# Patient Record
Sex: Female | Born: 1965 | Race: White | Hispanic: No | Marital: Single | Smoking: Never smoker
Health system: Southern US, Community
[De-identification: ages and names within clinical notes are randomized; demographics above are authoritative.]

## PROBLEM LIST (undated history)

## (undated) DIAGNOSIS — Z9889 Other specified postprocedural states: Secondary | ICD-10-CM

## (undated) DIAGNOSIS — F0630 Mood disorder due to known physiological condition, unspecified: Secondary | ICD-10-CM

## (undated) DIAGNOSIS — E282 Polycystic ovarian syndrome: Secondary | ICD-10-CM

## (undated) DIAGNOSIS — I10 Essential (primary) hypertension: Secondary | ICD-10-CM

## (undated) DIAGNOSIS — R112 Nausea with vomiting, unspecified: Secondary | ICD-10-CM

## (undated) HISTORY — PX: TONSILLECTOMY: SHX5217

## (undated) HISTORY — DX: Mood disorder due to known physiological condition, unspecified: F06.30

## (undated) HISTORY — DX: Essential (primary) hypertension: I10

---

## 1997-12-20 ENCOUNTER — Other Ambulatory Visit: Admission: RE | Admit: 1997-12-20 | Discharge: 1997-12-20 | Payer: Self-pay | Admitting: Obstetrics and Gynecology

## 1998-02-05 ENCOUNTER — Other Ambulatory Visit: Admission: RE | Admit: 1998-02-05 | Discharge: 1998-02-05 | Payer: Self-pay | Admitting: *Deleted

## 1998-08-22 ENCOUNTER — Other Ambulatory Visit: Admission: RE | Admit: 1998-08-22 | Discharge: 1998-08-22 | Payer: Self-pay | Admitting: Obstetrics and Gynecology

## 2001-05-11 ENCOUNTER — Other Ambulatory Visit: Admission: RE | Admit: 2001-05-11 | Discharge: 2001-05-11 | Payer: Self-pay | Admitting: Obstetrics and Gynecology

## 2007-12-15 ENCOUNTER — Ambulatory Visit: Payer: Self-pay | Admitting: Family Medicine

## 2008-02-04 ENCOUNTER — Ambulatory Visit: Payer: Self-pay | Admitting: Occupational Medicine

## 2008-05-05 ENCOUNTER — Ambulatory Visit: Payer: Self-pay | Admitting: Family Medicine

## 2009-02-07 HISTORY — PX: OTHER SURGICAL HISTORY: SHX169

## 2010-02-11 ENCOUNTER — Ambulatory Visit: Payer: Self-pay | Admitting: Family Medicine

## 2010-02-11 DIAGNOSIS — E282 Polycystic ovarian syndrome: Secondary | ICD-10-CM | POA: Insufficient documentation

## 2010-02-11 DIAGNOSIS — I10 Essential (primary) hypertension: Secondary | ICD-10-CM

## 2010-02-11 HISTORY — DX: Essential (primary) hypertension: I10

## 2010-02-12 ENCOUNTER — Encounter: Payer: Self-pay | Admitting: Family Medicine

## 2010-02-12 DIAGNOSIS — E785 Hyperlipidemia, unspecified: Secondary | ICD-10-CM | POA: Insufficient documentation

## 2010-02-12 LAB — CONVERTED CEMR LAB
ALT: 13 units/L (ref 0–35)
AST: 18 units/L (ref 0–37)
Alkaline Phosphatase: 57 units/L (ref 39–117)
Cholesterol, target level: 200 mg/dL
Glucose, Bld: 102 mg/dL — ABNORMAL HIGH (ref 70–99)
LDL Cholesterol: 157 mg/dL — ABNORMAL HIGH (ref 0–99)
Potassium: 4.6 meq/L (ref 3.5–5.3)
Sodium: 138 meq/L (ref 135–145)
TSH: 2.572 microintl units/mL (ref 0.350–4.500)
Total Bilirubin: 0.5 mg/dL (ref 0.3–1.2)
Total Protein: 7 g/dL (ref 6.0–8.3)
Triglycerides: 144 mg/dL (ref <150)
VLDL: 29 mg/dL (ref 0–40)

## 2010-04-08 ENCOUNTER — Telehealth (INDEPENDENT_AMBULATORY_CARE_PROVIDER_SITE_OTHER): Payer: Self-pay | Admitting: *Deleted

## 2010-04-09 NOTE — Assessment & Plan Note (Signed)
Summary: CPE   Vital Signs:  Patient profile:   45 year old female Height:      65 inches Weight:      206 pounds Pulse rate:   55 / minute BP sitting:   127 / 73  (right arm) Cuff size:   regular  Vitals Entered By: Avon Gully CMA, Duncan Dull) (February 11, 2010 10:42 AM) CC: CPE   Primary Care Charliene Inoue:  Nani Gasser MD  CC:  CPE.  History of Present Illness: Overall dong well. Started on metformin by her PCP for PCOS. Also started on a BP medication as had severeal high BPs there as well. She has been tolerating this well without SE.   Current Medications (verified): 1)  Mucinex D [phone] Mg Xr12h-Tab (Pseudoephedrine-Guaifenesin) 2)  Doxycycline Hyclate 100 Mg Caps (Doxycycline Hyclate) .... Take 1 Tablet By Mouth Two Times A Day For 10 Days 3)  Atenolol 100 Mg Tabs (Atenolol) 4)  Metformin Hcl 500 Mg Tabs (Metformin Hcl)  Allergies (verified): No Known Drug Allergies  Comments:  Nurse/Medical Assistant: The patient's medications and allergies were reviewed with the patient and were updated in the Medication and Allergy Lists. Avon Gully CMA, Duncan Dull) (February 11, 2010 10:43 AM)  Past History:  Past Medical History: Dr. Jannette Fogo at [facility]  Past Surgical History: Tonsillectomy Uterine Ablation 02/2009  Family History: Reviewed history from 12/15/2007 and no changes required. MOther with MI, depression, hi chol, HTN  Social History: Reviewed history from 12/15/2007 and no changes required. Partner is [employer]. Adopted son is Neita Goodnight (from [place]).  Have 2 children.  Works for [employer], [employer], [employer]. HS degree.  Never Smoked Alcohol use-yes Drug use-no Regular exercise-yes  Review of Systems  The patient denies anorexia, fever, weight loss, weight gain, vision loss, decreased hearing, hoarseness, chest pain, syncope, dyspnea on exertion, peripheral edema, prolonged cough, headaches, hemoptysis, abdominal pain, melena,  hematochezia, severe indigestion/heartburn, hematuria, incontinence, genital sores, muscle weakness, suspicious skin lesions, transient blindness, difficulty walking, depression, unusual weight change, abnormal bleeding, enlarged lymph nodes, and breast masses.    Physical Exam  General:  Well-developed,well-nourished,in no acute distress; alert,appropriate and cooperative throughout examination Head:  Normocephalic and atraumatic without obvious abnormalities. No apparent alopecia or balding. Eyes:  No corneal or conjunctival inflammation noted. EOMI. Perrla.  Ears:  External ear exam shows no significant lesions or deformities.  Otoscopic examination reveals clear canals, tympanic membranes are intact bilaterally without bulging, retraction, inflammation or discharge. Hearing is grossly normal bilaterally. Nose:  External nasal examination shows no deformity or inflammation.  Mouth:  Oral mucosa and oropharynx without lesions or exudates.  Teeth in good repair. Neck:  No deformities, masses, or tenderness noted. Chest Wall:  No deformities, masses, or tenderness noted. Lungs:  Normal respiratory effort, chest expands symmetrically. Lungs are clear to auscultation, no crackles or wheezes. Heart:  Normal rate and regular rhythm. S1 and S2 normal without gallop, murmur, click, rub or other extra sounds. Abdomen:  Bowel sounds positive,abdomen soft and non-tender without masses, organomegaly or hernias noted. Msk:  No deformity or scoliosis noted of thoracic or lumbar spine.   Pulses:  R and L carotid,radial,dorsalis pedis and posterior tibial pulses are full and equal bilaterally Extremities:  No clubbing, cyanosis, edema, or deformity noted with normal full range of motion of all joints.   Neurologic:  No cranial nerve deficits noted. Station and gait are normal. DTRs are symmetrical throughout. Sensory, motor and coordinative functions appear intact.  Skin:  no rashes.   Cervical Nodes:  No  lymphadenopathy noted Axillary Nodes:  No palpable lymphadenopathy Psych:  Cognition and judgment appear intact. Alert and cooperative with normal attention span and concentration. No apparent delusions, illusions, hallucinations   Impression & Recommendations:  Problem # 1:  HEALTH MAINTENANCE EXAM (ICD-V70.0) Encourage regular exercise.  Given tdap and flu vaccines today.  Reminded her to get her pelvic and pap with her gyn today.  Due for screening labwork as well.   Problem # 2:  HYPERTENSION, BENIGN (ICD-401.1) f/u in 6months Will check TSH as well.  Her updated medication list for this problem includes:    Atenolol 100 Mg Tabs (Atenolol) .Marland Kitchen... Take 1 tablet by mouth once a day    Hydrochlorothiazide 25 Mg Tabs (Hydrochlorothiazide) .Marland Kitchen... Take 1 tablet by mouth once a day  Orders: T-Comprehensive Metabolic Panel [phone]) T-Lipid Profile ([id number]) T-TSH [phone]) T- Hemoglobin A1C ([id number])  Complete Medication List: 1)  Atenolol 100 Mg Tabs (Atenolol) .... Take 1 tablet by mouth once a day 2)  Metformin Hcl 500 Mg Tabs (Metformin hcl) .... Take 1 tablet by mouth three times a day 3)  Hydrochlorothiazide 25 Mg Tabs (Hydrochlorothiazide) .... Take 1 tablet by mouth once a day  Other Orders: Flu Vaccine 29yrs + (28413) Admin 1st Vaccine (24401) Tdap => 72yrs IM (02725) Admin of Any Addtl Vaccine (36644)  Patient Instructions: 1)  Remember to get your mammogram and your pap smear this year.  2)  We will call you with your lab results.  3)  Your blood pressure looks great today. Follow up in 6 months for blood pressure (orwith your gyn).  4)  It is important that you exercise reguarly at least 20 minutes 5 times a week. If you develop chest pain, have severe difficulty breathing, or feel very tired, stop exercising immediately and seek medical attention.  5)  Take calcium +vitamin D daily.    Orders Added: 1)  T-Comprehensive Metabolic Panel  [[id number]] 2)  T-Lipid Profile [[id number]] 3)  T-TSH [[id number]] 4)  T- Hemoglobin A1C [83036-23375] 5)  Est. Patient age 38-64 [35] 6)  Flu Vaccine 1yrs + [90658] 7)  Admin 1st Vaccine [90471] 8)  Tdap => 73yrs IM [90715] 9)  Admin of Any Addtl Vaccine [90472]   Immunizations Administered:  Influenza Vaccine # 1:    Vaccine Type: Fluvax 3+    Site: left deltoid    Mfr: GlaxoSmithKline    Dose: 0.5 ml    Route: IM    Given by: Sue Lush McCrimmon CMA, (AAMA)    Exp. Date: 08/09/2010    Lot #: [id number]    VIS given: 10/02/09 version given February 11, 2010.  Tetanus Vaccine:    Vaccine Type: Tdap    Site: right deltoid    Mfr: GlaxoSmithKline    Dose: 0.5 ml    Route: IM    Given by: Sue Lush McCrimmon CMA, (AAMA)    Exp. Date: 12/09/2011    Lot #: [id number]    VIS given: 01/26/08 version given February 11, 2010.  Flu Vaccine Consent Questions:    Do you have a history of severe allergic reactions to this vaccine? no    Any prior history of allergic reactions to egg and/or gelatin? no    Do you have a sensitivity to the preservative Thimersol? no    Do you have a past history of Guillan-Barre Syndrome? no    Do you currently have an acute febrile illness? no  Have you ever had a severe reaction to latex? no    Vaccine information given and explained to patient? no    Are you currently pregnant? no   Immunizations Administered:  Influenza Vaccine # 1:    Vaccine Type: Fluvax 3+    Site: left deltoid    Mfr: GlaxoSmithKline    Dose: 0.5 ml    Route: IM    Given by: Sue Lush McCrimmon CMA, (AAMA)    Exp. Date: 08/09/2010    Lot #: [id number]    VIS given: 10/02/09 version given February 11, 2010.  Tetanus Vaccine:    Vaccine Type: Tdap    Site: right deltoid    Mfr: GlaxoSmithKline    Dose: 0.5 ml    Route: IM    Given by: Sue Lush McCrimmon CMA, (AAMA)    Exp. Date: 12/09/2011    Lot #: [id number]    VIS given: 01/26/08 version given February 11, 2010.

## 2010-04-17 NOTE — Progress Notes (Signed)
Summary: Needs mammo  Phone Note Outgoing Call   Summary of Call: Call EA:VWUJW mammogram. Can give her phone number downstairs to schedule.  Initial call taken by: Nani Gasser MD,  April 08, 2010 10:28 AM  Follow-up for Phone Call        [facility] informing Pt of the above Follow-up by: Payton Spark CMA,  April 09, 2010 9:49 AM

## 2010-07-18 ENCOUNTER — Encounter: Payer: Self-pay | Admitting: Family Medicine

## 2010-07-18 ENCOUNTER — Ambulatory Visit (INDEPENDENT_AMBULATORY_CARE_PROVIDER_SITE_OTHER): Payer: Commercial Indemnity | Admitting: Family Medicine

## 2010-07-18 DIAGNOSIS — I10 Essential (primary) hypertension: Secondary | ICD-10-CM

## 2010-07-18 DIAGNOSIS — L089 Local infection of the skin and subcutaneous tissue, unspecified: Secondary | ICD-10-CM

## 2010-07-18 DIAGNOSIS — R1310 Dysphagia, unspecified: Secondary | ICD-10-CM

## 2010-07-18 DIAGNOSIS — E785 Hyperlipidemia, unspecified: Secondary | ICD-10-CM

## 2010-07-18 MED ORDER — CITALOPRAM HYDROBROMIDE 20 MG PO TABS: 20.0000 mg | ORAL_TABLET | Freq: Every day | ORAL | Status: DC

## 2010-07-18 NOTE — Assessment & Plan Note (Signed)
Due to recheck.  It was elevated in December.  She has been exercising more regularly so hopefully it will look better.

## 2010-07-18 NOTE — Assessment & Plan Note (Signed)
BP elevated today but home BPs look great. She was a little anxious as she was late for her appt.  Asked pt to f/u in 1 mo to recheck BP and bing in her home cuff so we can verify its validity.  Continue current regimen until then.

## 2010-07-18 NOTE — Progress Notes (Signed)
  Subjective:    Patient ID: Alicia Steele, female    DOB: [birth year], 45 y.o.   MRN: [id number]  HPI 3 weeks ago had a grilled steak and then felt like it got lodged at the top of her stomach.  Has had this happen maybe 10 times.   The tied to dink to get it to go down but it wasn't going down.  Took almost an 1.5 hour to go down.  Thought initially had eaten too big of a bite.  No recent heartburn or GERD sxs.  No Nausea or vomiting.   HTN - Taking meds regularly. Recently started exercise regimen. Has lost 4 lbs since her last OV. No CP, SOB, dizziness.  BPs in th 120s/70s at home. Highest has been 132/82.  Uses a wrist cuff  Has a splinter for about a week that she would like me to try to remove.   Review of Systems     Objective:   Physical Exam  Constitutional: She appears well-developed and well-nourished.  HENT:  Head: Normocephalic and atraumatic.  Eyes: Conjunctivae are normal. Pupils are equal, round, and reactive to light.  Cardiovascular: Normal rate, regular rhythm and normal heart sounds.   Pulmonary/Chest: Effort normal and breath sounds normal.  Musculoskeletal: She exhibits no edema.  Skin:       Right hand, first finger with a splinter. Area lanced and splinter removed.           Assessment & Plan:  Dysphagia - refer to GI for possible  esophageal stricture.  Prefers [place]. In the meantime avoid meats, chew food really well, adn eat very small bites. Go to ED if can't get food to pass.    Splinter removal - pt tolerated well. Minimal bleeding.

## 2010-07-18 NOTE — Patient Instructions (Addendum)
We will call you with your lab results.  Go for your labs fasting anytime ibn the next 2 weeks.

## 2010-08-19 ENCOUNTER — Other Ambulatory Visit: Payer: Self-pay | Admitting: Family Medicine

## 2010-08-19 DIAGNOSIS — I10 Essential (primary) hypertension: Secondary | ICD-10-CM

## 2010-08-19 MED ORDER — TRIAMTERENE-HCTZ 37.5-25 MG PO CAPS: 1.0000 | ORAL_CAPSULE | ORAL | Status: DC

## 2010-08-19 NOTE — Telephone Encounter (Signed)
Received refill request for triam/hctz 37.5/25.  Last OV/CPE 02-11-10.  Note says to return in 6 mths for BP follow up. Plan:  Refill sent electronically for 30 days, and pt notified that she will need a  follow up BP visit . Jarvis Newcomer, LPN Domingo Dimes

## 2010-08-20 ENCOUNTER — Encounter: Payer: Self-pay | Admitting: Family Medicine

## 2010-08-20 DIAGNOSIS — K200 Eosinophilic esophagitis: Secondary | ICD-10-CM | POA: Insufficient documentation

## 2010-08-27 ENCOUNTER — Other Ambulatory Visit: Payer: Self-pay | Admitting: Family Medicine

## 2010-08-27 MED ORDER — METFORMIN HCL 500 MG PO TABS: 500.0000 mg | ORAL_TABLET | Freq: Three times a day (TID) | ORAL | Status: DC

## 2010-08-27 NOTE — Telephone Encounter (Signed)
Gregg from [facility] called and pt is requesting refill for her metformin 500 mg tid Plan:  Reviewed chart.  Recent OV.  #90/2 refills given verbally, and placed in system electronically. Jarvis Newcomer, LPN Domingo Dimes

## 2010-09-19 ENCOUNTER — Other Ambulatory Visit: Payer: Self-pay | Admitting: Family Medicine

## 2010-09-19 ENCOUNTER — Ambulatory Visit (INDEPENDENT_AMBULATORY_CARE_PROVIDER_SITE_OTHER): Payer: Commercial Indemnity | Admitting: Family Medicine

## 2010-09-19 VITALS — BP 140/80 | HR 78

## 2010-09-19 DIAGNOSIS — I10 Essential (primary) hypertension: Secondary | ICD-10-CM

## 2010-09-19 MED ORDER — ATENOLOL 100 MG PO TABS: 100.0000 mg | ORAL_TABLET | Freq: Every day | ORAL | Status: DC

## 2010-09-19 NOTE — Progress Notes (Signed)
  Subjective:    Patient ID: Alicia Steele, female    DOB: [birth year], 45 y.o.   MRN: [id number]  HPI   Pt denies chest pain, SOB, dizziness, or heart palpitations.  Taking meds as directed w/o problems.  Denies medication side effects.  5 min spent with pt.  Review of Systems     Objective:   Physical Exam        Assessment & Plan:  HTN- Increase atenolol to bid. Follow up with me in 6 weeks.

## 2010-09-20 ENCOUNTER — Telehealth: Payer: Self-pay | Admitting: Family Medicine

## 2010-09-20 LAB — LIPID PANEL
Cholesterol: 245 mg/dL — ABNORMAL HIGH (ref 0–200)
HDL: 69 mg/dL (ref >39)
Total CHOL/HDL Ratio: 3.6 Ratio
VLDL: 30 mg/dL (ref 0–40)

## 2010-09-20 LAB — GLUCOSE, RANDOM: Glucose, Bld: 94 mg/dL (ref 70–99)

## 2010-09-20 NOTE — Telephone Encounter (Signed)
Patient: Cholesterol overall just a little it better. Her LDL is down about 10 points but this is still high. I would recommend continue to work with low fat diet regular exercise and let's recheck again in 6 months. Is under 100 which looks great. We'll recheck this in one year.

## 2010-09-22 ENCOUNTER — Other Ambulatory Visit: Payer: Self-pay | Admitting: Family Medicine

## 2010-09-23 ENCOUNTER — Telehealth: Payer: Self-pay | Admitting: Family Medicine

## 2010-09-23 NOTE — Telephone Encounter (Signed)
Pt needs to follow up around 11-03-10 as instructed at last office visit on 09-19-10.

## 2010-09-23 NOTE — Telephone Encounter (Signed)
LMOM advising pt of results and rec. 

## 2010-09-24 NOTE — Telephone Encounter (Signed)
Closed

## 2010-10-25 ENCOUNTER — Other Ambulatory Visit: Payer: Self-pay | Admitting: Family Medicine

## 2010-10-25 NOTE — Telephone Encounter (Signed)
PLEASE HAVE PT CALL TRIAGE NURSE TO SCHEDULE AN 6 WEEK FOLLOW UP FOR BP IF DOES NOT ALREADY HAVE AN APPT.  NEEDS TO BE SEEN NOW FOR BP.

## 2010-11-17 ENCOUNTER — Other Ambulatory Visit: Payer: Self-pay | Admitting: Family Medicine

## 2010-11-25 ENCOUNTER — Other Ambulatory Visit: Payer: Self-pay | Admitting: Family Medicine

## 2011-01-20 ENCOUNTER — Other Ambulatory Visit: Payer: Self-pay | Admitting: *Deleted

## 2011-01-20 MED ORDER — CITALOPRAM HYDROBROMIDE 20 MG PO TABS: 20.0000 mg | ORAL_TABLET | Freq: Every day | ORAL | Status: DC

## 2011-01-21 ENCOUNTER — Other Ambulatory Visit: Payer: Self-pay | Admitting: Family Medicine

## 2011-01-29 ENCOUNTER — Other Ambulatory Visit: Payer: Self-pay | Admitting: Family Medicine

## 2011-02-14 ENCOUNTER — Other Ambulatory Visit: Payer: Self-pay | Admitting: Family Medicine

## 2011-02-27 ENCOUNTER — Other Ambulatory Visit: Payer: Self-pay | Admitting: Family Medicine

## 2011-03-22 ENCOUNTER — Other Ambulatory Visit: Payer: Self-pay | Admitting: Family Medicine

## 2011-03-24 ENCOUNTER — Other Ambulatory Visit: Payer: Self-pay | Admitting: Family Medicine

## 2011-04-08 ENCOUNTER — Telehealth: Payer: Self-pay | Admitting: Family Medicine

## 2011-04-08 DIAGNOSIS — R899 Unspecified abnormal finding in specimens from other organs, systems and tissues: Secondary | ICD-10-CM

## 2011-04-08 NOTE — Telephone Encounter (Signed)
Called patient she is due for a six-month followup for her high blood pressure in addition to a BMP. We can order the labs if she would like to go ahead of time that is up to her.

## 2011-04-10 NOTE — Telephone Encounter (Signed)
Pt informed. States she will have blood work done and will schedule appt to f/u.

## 2011-04-23 LAB — BASIC METABOLIC PANEL
CO2: 21 mEq/L (ref 19–32)
Calcium: 9.4 mg/dL (ref 8.4–10.5)
Chloride: 104 mEq/L (ref 96–112)
Glucose, Bld: 97 mg/dL (ref 70–99)
Sodium: 138 mEq/L (ref 135–145)

## 2011-04-23 LAB — LIPID PANEL
HDL: 66 mg/dL (ref >39)
LDL Cholesterol: 133 mg/dL — ABNORMAL HIGH (ref 0–99)
Total CHOL/HDL Ratio: 3.6 Ratio

## 2011-04-26 ENCOUNTER — Other Ambulatory Visit: Payer: Self-pay | Admitting: Family Medicine

## 2011-04-26 DIAGNOSIS — E875 Hyperkalemia: Secondary | ICD-10-CM

## 2011-04-28 ENCOUNTER — Other Ambulatory Visit: Payer: Self-pay | Admitting: *Deleted

## 2011-04-28 MED ORDER — METFORMIN HCL 500 MG PO TABS: 500.0000 mg | ORAL_TABLET | Freq: Three times a day (TID) | ORAL | Status: DC

## 2011-05-25 ENCOUNTER — Other Ambulatory Visit: Payer: Self-pay | Admitting: Family Medicine

## 2011-05-26 NOTE — Telephone Encounter (Signed)
Needs appointment

## 2011-06-22 ENCOUNTER — Other Ambulatory Visit: Payer: Self-pay | Admitting: Family Medicine

## 2011-06-30 ENCOUNTER — Telehealth: Payer: Self-pay | Admitting: Family Medicine

## 2011-06-30 NOTE — Telephone Encounter (Signed)
Please call patients who she would like to go ahead and schedule a mammogram. I do not see one on call for her. If she does have his let me know and we can schedule her downstairs.

## 2011-07-01 NOTE — Telephone Encounter (Signed)
Left message on vm

## 2011-07-17 ENCOUNTER — Other Ambulatory Visit: Payer: Self-pay | Admitting: Family Medicine

## 2011-07-21 ENCOUNTER — Other Ambulatory Visit: Payer: Self-pay | Admitting: Family Medicine

## 2011-07-25 ENCOUNTER — Ambulatory Visit (INDEPENDENT_AMBULATORY_CARE_PROVIDER_SITE_OTHER): Payer: Commercial Indemnity | Admitting: Family Medicine

## 2011-07-25 ENCOUNTER — Encounter: Payer: Self-pay | Admitting: Family Medicine

## 2011-07-25 VITALS — BP 135/86 | HR 71 | Wt 201.0 lb

## 2011-07-25 DIAGNOSIS — E785 Hyperlipidemia, unspecified: Secondary | ICD-10-CM

## 2011-07-25 DIAGNOSIS — R899 Unspecified abnormal finding in specimens from other organs, systems and tissues: Secondary | ICD-10-CM

## 2011-07-25 DIAGNOSIS — I10 Essential (primary) hypertension: Secondary | ICD-10-CM

## 2011-07-25 DIAGNOSIS — E282 Polycystic ovarian syndrome: Secondary | ICD-10-CM

## 2011-07-25 DIAGNOSIS — R6889 Other general symptoms and signs: Secondary | ICD-10-CM

## 2011-07-25 LAB — POCT GLYCOSYLATED HEMOGLOBIN (HGB A1C): Hemoglobin A1C: 5.6

## 2011-07-25 MED ORDER — METFORMIN HCL 500 MG PO TABS: 500.0000 mg | ORAL_TABLET | Freq: Three times a day (TID) | ORAL | Status: DC

## 2011-07-25 MED ORDER — CITALOPRAM HYDROBROMIDE 20 MG PO TABS: 20.0000 mg | ORAL_TABLET | Freq: Every day | ORAL | Status: DC

## 2011-07-25 MED ORDER — METOPROLOL SUCCINATE ER 50 MG PO TB24: 50.0000 mg | ORAL_TABLET | Freq: Every day | ORAL | Status: DC

## 2011-07-25 MED ORDER — TRIAMTERENE-HCTZ 37.5-25 MG PO CAPS: 1.0000 | ORAL_CAPSULE | Freq: Every day | ORAL | Status: DC

## 2011-07-25 NOTE — Patient Instructions (Signed)
Please call to schedule [phone] your mammogram.

## 2011-07-25 NOTE — Progress Notes (Signed)
  Subjective:    Patient ID: Alicia Steele, female    DOB: 23-Feb-1966, 46 y.o.   MRN: [id number]  HPI HTN - No CP or SOB.  Tolerates the antenolol well. Regular exercise.  Trying to eat a low salt diet.  No swelling.  Taking her medications regularly. No recent NSAID use. She does need refills on all of her medications today. She did have some questions about taking the metformin for her PCO S. She wanted to know if it was still necessary or not.   Review of Systems     Objective:   Physical Exam  Constitutional: She is oriented to person, place, and time. She appears well-developed and well-nourished.  HENT:  Head: Normocephalic and atraumatic.  Cardiovascular: Normal rate, regular rhythm and normal heart sounds.   Pulmonary/Chest: Effort normal and breath sounds normal.  Neurological: She is alert and oriented to person, place, and time.  Skin: Skin is warm and dry.  Psychiatric: She has a normal mood and affect. Her behavior is normal.          Assessment & Plan:  HTN- Well controlled. Discussed will change to metoprolol for better risk benefits.  F/U in 3 months. Work on low salt diet and exercise. F/U in 4 months.   Overdue fo rmammo. Will give her the number to call. We had already made referral before but she didn't schedule.   PCO S.-we discussed that the metformin helps regulate her weight as well as helping to regulate her blood sugars. She is not interested in fertility at this time. We did discuss that she could come off the medication if she would like if her A1c is well-controlled, and she continues to eat healthy maintain a healthy weight and we monitor her blood sugars. Her A1c was well-controlled today but she opted to continue metformin especially with the potential weight benefits. I did encourage more regular exercise. Lab Results  Component Value Date   HGBA1C 5.6 07/25/2011   Hyperlipidemia-due to recheck lipid levels today. Lab slip given today.  Abnormal  potassium level-well over due to recheck. Lab slip given today.

## 2011-07-26 ENCOUNTER — Other Ambulatory Visit: Payer: Self-pay | Admitting: Family Medicine

## 2011-09-03 ENCOUNTER — Other Ambulatory Visit: Payer: Self-pay | Admitting: Family Medicine

## 2011-11-01 ENCOUNTER — Other Ambulatory Visit: Payer: Self-pay | Admitting: Family Medicine

## 2011-11-02 ENCOUNTER — Other Ambulatory Visit: Payer: Self-pay | Admitting: Family Medicine

## 2011-11-25 ENCOUNTER — Encounter: Payer: Commercial Indemnity | Admitting: Family Medicine

## 2011-11-25 DIAGNOSIS — Z0289 Encounter for other administrative examinations: Secondary | ICD-10-CM

## 2012-01-01 ENCOUNTER — Other Ambulatory Visit: Payer: Self-pay | Admitting: Family Medicine

## 2012-02-07 ENCOUNTER — Other Ambulatory Visit: Payer: Self-pay | Admitting: Family Medicine

## 2012-02-29 ENCOUNTER — Other Ambulatory Visit: Payer: Self-pay | Admitting: Family Medicine

## 2012-03-05 ENCOUNTER — Other Ambulatory Visit: Payer: Self-pay | Admitting: Family Medicine

## 2012-03-23 ENCOUNTER — Other Ambulatory Visit: Payer: Self-pay | Admitting: Family Medicine

## 2012-04-15 ENCOUNTER — Other Ambulatory Visit: Payer: Self-pay | Admitting: Family Medicine

## 2012-05-12 ENCOUNTER — Other Ambulatory Visit: Payer: Self-pay | Admitting: Family Medicine

## 2012-05-12 DIAGNOSIS — Z1231 Encounter for screening mammogram for malignant neoplasm of breast: Secondary | ICD-10-CM

## 2012-05-20 ENCOUNTER — Ambulatory Visit (INDEPENDENT_AMBULATORY_CARE_PROVIDER_SITE_OTHER): Payer: Commercial Indemnity

## 2012-05-20 ENCOUNTER — Encounter: Payer: Self-pay | Admitting: Family Medicine

## 2012-05-20 ENCOUNTER — Ambulatory Visit (INDEPENDENT_AMBULATORY_CARE_PROVIDER_SITE_OTHER): Payer: Commercial Indemnity | Admitting: Family Medicine

## 2012-05-20 VITALS — BP 145/84 | HR 72 | Ht 66.0 in | Wt 186.0 lb

## 2012-05-20 DIAGNOSIS — R928 Other abnormal and inconclusive findings on diagnostic imaging of breast: Secondary | ICD-10-CM

## 2012-05-20 DIAGNOSIS — E785 Hyperlipidemia, unspecified: Secondary | ICD-10-CM

## 2012-05-20 DIAGNOSIS — I10 Essential (primary) hypertension: Secondary | ICD-10-CM

## 2012-05-20 DIAGNOSIS — F0630 Mood disorder due to known physiological condition, unspecified: Secondary | ICD-10-CM | POA: Insufficient documentation

## 2012-05-20 DIAGNOSIS — E282 Polycystic ovarian syndrome: Secondary | ICD-10-CM

## 2012-05-20 DIAGNOSIS — F39 Unspecified mood [affective] disorder: Secondary | ICD-10-CM

## 2012-05-20 DIAGNOSIS — Z1231 Encounter for screening mammogram for malignant neoplasm of breast: Secondary | ICD-10-CM

## 2012-05-20 HISTORY — DX: Mood disorder due to known physiological condition, unspecified: F06.30

## 2012-05-20 LAB — COMPLETE METABOLIC PANEL WITH GFR
ALT: 13 U/L (ref 0–35)
AST: 15 U/L (ref 0–37)
Albumin: 4.3 g/dL (ref 3.5–5.2)
Alkaline Phosphatase: 53 U/L (ref 39–117)
Potassium: 4.7 mEq/L (ref 3.5–5.3)
Sodium: 134 mEq/L — ABNORMAL LOW (ref 135–145)
Total Protein: 7.1 g/dL (ref 6.0–8.3)

## 2012-05-20 LAB — LIPID PANEL
LDL Cholesterol: 124 mg/dL — ABNORMAL HIGH (ref 0–99)
Total CHOL/HDL Ratio: 3.1 Ratio
VLDL: 25 mg/dL (ref 0–40)

## 2012-05-20 MED ORDER — METOPROLOL SUCCINATE ER 50 MG PO TB24: 50.0000 mg | ORAL_TABLET | Freq: Every day | ORAL | Status: DC

## 2012-05-20 MED ORDER — METFORMIN HCL 500 MG PO TABS: 500.0000 mg | ORAL_TABLET | Freq: Every day | ORAL | Status: DC

## 2012-05-20 MED ORDER — CITALOPRAM HYDROBROMIDE 20 MG PO TABS: 20.0000 mg | ORAL_TABLET | Freq: Every day | ORAL | Status: DC

## 2012-05-20 MED ORDER — TRIAMTERENE-HCTZ 37.5-25 MG PO CAPS: 1.0000 | ORAL_CAPSULE | Freq: Every day | ORAL | Status: DC

## 2012-05-20 NOTE — Progress Notes (Signed)
Subjective:    Patient ID: Alicia Steele, female    DOB: 1965-09-03, 47 y.o.   MRN: [id number]  HPI HTN -  Pt denies chest pain, SOB, dizziness, or heart palpitations.  Taking meds as directed w/o problems.  Denies medication side effects. Home BPs running under 140.   Hyperlipidemia-Not on medication. Some exercise but not consistent. She has cut back on portion sizes that has lost a couple pounds.  PCO S.-on metformin for PCOS. Tolerating well. No regular exercise since the fall.   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 Works well. Doesn't want to stop it. Sleeping well.    Review of Systems BP 145/84  Pulse 72  Ht 5\' 6"  (1.676 m)  Wt 186 lb (84.369 kg)  BMI 30.04 kg/m2    No Known Allergies  No past medical history on file.  Past Surgical History  Procedure Laterality Date  . Tonsillectomy    . Uterine ablation  02/2009    History   Social History  . Marital Status: Single    Spouse Name: N/A    Number of Children: N/A  . Years of Education: N/A   Occupational History  . Not on file.   Social History Main Topics  . Smoking status: Never Smoker   . Smokeless tobacco: Not on file  . Alcohol Use: Yes  . Drug Use: No  . Sexually Active:    Other Topics Concern  . Not on file   Social History Narrative  . No narrative on file    Family History  Problem Relation Age of Onset  . Heart disease Mother     MI  . Hyperlipidemia Mother   . Hypertension Mother   . Depression Mother     Outpatient Encounter Prescriptions as of 05/20/2012  Medication Sig Dispense Refill  . citalopram (CELEXA) 20 MG tablet Take 1 tablet (20 mg total) by mouth daily.  30 tablet  6  . metFORMIN (GLUCOPHAGE) 500 MG tablet Take 1 tablet (500 mg total) by mouth daily with breakfast.  30 tablet  4  . metoprolol succinate (TOPROL-XL) 50 MG 24 hr tablet Take 1 tablet (50 mg total) by mouth daily. Take with or immediately following a meal.  30 tablet  4  .  triamterene-hydrochlorothiazide (DYAZIDE) 37.5-25 MG per capsule Take 1 each (1 capsule total) by mouth daily.  30 capsule  4  . [DISCONTINUED] atenolol (TENORMIN) 100 MG tablet TAKE ONE TABLET BY MOUTH ONE TIME DAILY  30 tablet  4  . [DISCONTINUED] citalopram (CELEXA) 20 MG tablet TAKE ONE TABLET BY MOUTH ONE TIME DAILY  30 tablet  0  . [DISCONTINUED] metFORMIN (GLUCOPHAGE) 500 MG tablet TAKE ONE TABLET BY MOUTH THREE TIMES DAILY  30 tablet  0  . [DISCONTINUED] metoprolol succinate (TOPROL-XL) 50 MG 24 hr tablet TAKE ONE TABLET BY MOUTH ONE TIME DAILY IMMEDIATELY FOLLOWING A MEAL.  30 tablet  2  . [DISCONTINUED] triamterene-hydrochlorothiazide (DYAZIDE) 37.5-25 MG per capsule TAKE ONE CAPSULE BY MOUTH ONE TIME DAILY  30 capsule  0  . [DISCONTINUED] citalopram (CELEXA) 20 MG tablet Take 1 tablet (20 mg total) by mouth daily.  90 tablet  1   No facility-administered encounter medications on file as of 05/20/2012.          Objective:   Physical Exam  Constitutional: She is oriented to person, place, and time. She appears well-developed and well-nourished.  HENT:  Head: Normocephalic and atraumatic.  Neck: Neck supple. No thyromegaly present.  Cardiovascular: Normal rate, regular rhythm and normal heart sounds.   No carotid bruits.   Pulmonary/Chest: Effort normal and breath sounds normal.  Musculoskeletal: She exhibits no edema.  Lymphadenopathy:    She has no cervical adenopathy.  Neurological: She is alert and oriented to person, place, and time.  Skin: Skin is warm and dry.  Psychiatric: She has a normal mood and affect. Her behavior is normal.          Assessment & Plan:  HTN- Uncontrolled today but home BPs well controlled. She will check them more closely over the next months.  Make sure eating low salt diet. Has already cut back on her portion sizes.  Check BMP today.    Hyperlipidemia-Due recheck levels.  Given lab slip today.   PCO S.-WEll controlled. Recommend more  regular exercise. A1C is 5.5 which is fantastic. We'll continue to monitor.  Mood D/O - Well controlled on celexa.  GAD-7 score of 7.  Has trouble relaxing.  She wants to continue the medication is not interested in weaning off of it at this time.

## 2012-05-21 ENCOUNTER — Other Ambulatory Visit: Payer: Self-pay | Admitting: Family Medicine

## 2012-05-21 DIAGNOSIS — E871 Hypo-osmolality and hyponatremia: Secondary | ICD-10-CM

## 2012-05-21 DIAGNOSIS — R7309 Other abnormal glucose: Secondary | ICD-10-CM

## 2012-05-24 ENCOUNTER — Other Ambulatory Visit: Payer: Self-pay | Admitting: Family Medicine

## 2012-05-24 DIAGNOSIS — R928 Other abnormal and inconclusive findings on diagnostic imaging of breast: Secondary | ICD-10-CM

## 2012-05-25 NOTE — Progress Notes (Signed)
Pt called back wanting to know about the results of her mammogram

## 2012-05-28 LAB — HM PAP SMEAR: HM Pap smear: NEGATIVE

## 2012-06-04 ENCOUNTER — Ambulatory Visit
Admission: RE | Admit: 2012-06-04 | Discharge: 2012-06-04 | Disposition: A | Discharge status: Home / Self Care | Payer: Commercial Indemnity | Source: Ambulatory Visit | Attending: Family Medicine | Admitting: Family Medicine

## 2012-06-04 DIAGNOSIS — R928 Other abnormal and inconclusive findings on diagnostic imaging of breast: Secondary | ICD-10-CM

## 2012-06-22 ENCOUNTER — Telehealth: Payer: Self-pay | Admitting: *Deleted

## 2012-06-22 MED ORDER — METFORMIN HCL 500 MG PO TABS: 500.0000 mg | ORAL_TABLET | Freq: Three times a day (TID) | ORAL | Status: DC

## 2012-06-22 NOTE — Telephone Encounter (Signed)
Alicia Steele, Looks like Dr. Judie Petit meant to have this three times a day as originally prescribed, can you please relay this order to her pharmacy?

## 2012-06-22 NOTE — Telephone Encounter (Signed)
Pharmacist calls and states received rx for Metformin with directions of once a day. Pt states she has been taking this three times a day. Can you please clarify this

## 2012-08-17 ENCOUNTER — Ambulatory Visit (INDEPENDENT_AMBULATORY_CARE_PROVIDER_SITE_OTHER): Payer: Commercial Indemnity | Admitting: Family Medicine

## 2012-08-17 ENCOUNTER — Encounter: Payer: Self-pay | Admitting: Family Medicine

## 2012-08-17 VITALS — BP 150/95 | HR 98 | Wt 181.0 lb

## 2012-08-17 DIAGNOSIS — F4389 Other reactions to severe stress: Secondary | ICD-10-CM

## 2012-08-17 DIAGNOSIS — I10 Essential (primary) hypertension: Secondary | ICD-10-CM

## 2012-08-17 DIAGNOSIS — F438 Other reactions to severe stress: Secondary | ICD-10-CM

## 2012-08-17 DIAGNOSIS — F430 Acute stress reaction: Secondary | ICD-10-CM

## 2012-08-17 MED ORDER — CLONAZEPAM 0.5 MG PO TABS: ORAL_TABLET | ORAL | Status: DC

## 2012-08-17 MED ORDER — CITALOPRAM HYDROBROMIDE 40 MG PO TABS: 40.0000 mg | ORAL_TABLET | Freq: Every day | ORAL | Status: DC

## 2012-08-17 NOTE — Progress Notes (Signed)
CC: Alicia Steele is a 47 y.o. female is here for Depression and Anxiety   Subjective: HPI:  Patient presents for same day appointment complaining of crying spells and depressive episodes ever since her partner of over 20 years moved out somewhat abruptly.  This occurred Thursday of last week since then she describes crying spells lasting minutes anytime of the day, emotional liability, mood swings moderate to severe in severity occurring anytime of the day. Symptoms are worse the more she thinks about her former relationship, symptoms are greatly improved with distractibility and keeping busy. Relationship has been complicated ove rthe past 2+ years due to patient not feeling wanted/needed and not receiving any feeling of love or affection from her partner. Also complicated by 26 year old adopted son who is not taking the slpit up very well.  Patient has good social support with a circle that does not include her former partner. Patient denies thoughts wanting to harm herself or others. She continues to take Celexa 20 mg daily. She reports trouble sleeping due to the motions above on a nightly basis   Review Of Systems Outlined In HPI  Past Medical History  Diagnosis Date  . HYPERTENSION, BENIGN 02/11/2010    Qualifier: Diagnosis of  By: Linford Arnold MD, Santina Evans    . 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 Works well.       Family History  Problem Relation Age of Onset  . Heart disease Mother     MI  . Hyperlipidemia Mother   . Hypertension Mother   . Depression Mother      History  Substance Use Topics  . Smoking status: Never Smoker   . Smokeless tobacco: Not on file  . Alcohol Use: Yes     Objective: Filed Vitals:   08/17/12 1450  BP: 150/95  Pulse:     Vital signs reviewed. General: Alert and Oriented, No Acute Distress HEENT: Pupils equal, round, reactive to light. Conjunctivae clear.  External ears unremarkable.  Moist  mucous membranes. Lungs: Clear and comfortable work of breathing, speaking in full sentences without accessory muscle use. Cardiac: Regular rate and rhythm.  Neuro: CN II-XII grossly intact, gait normal. Extremities: No peripheral edema.  Strong peripheral pulses.  Mental Status: Mild anxiety but no agitation. Logical though process. Frequently tearful. Skin: Warm and dry. Assessment & Plan: Alicia Steele was seen today for depression and anxiety.  Diagnoses and associated orders for this visit:  HYPERTENSION, BENIGN  Acute stress disorder - clonazePAM (KLONOPIN) 0.5 MG tablet; Half to whole tab every evening only as needed for anxiety or insomnia. - citalopram (CELEXA) 40 MG tablet; Take 1 tablet (40 mg total) by mouth daily.    Acute stress disorder: We spent over 40 minutes face-to-face discussing coping mechanisms and healthy ways to deal with this unfortunate chapter in her life and provided counseling on her frustrations with the situation. Patient is not interested in counseling at this time, I've encouraged her to spend as much time with friends as possible to help give her support. I like her to go up on her Celexa to 40 mg a day and consider using small dose of Klonopin to help with sleep for sleep and avoid this during the day. Instructed her that I would only think she would need this for the next 2-3 weeks. Verbally contracted for safety  Return with PCP in one week  45 minutes spent face-to-face during visit today of which at  least 50% was counseling or coordinating care regarding acute stress disorder, essential hypertension    Return in about 1 day (around 08/18/2012).

## 2012-10-04 ENCOUNTER — Ambulatory Visit (INDEPENDENT_AMBULATORY_CARE_PROVIDER_SITE_OTHER): Payer: Commercial Indemnity | Admitting: Family Medicine

## 2012-10-04 ENCOUNTER — Encounter: Payer: Self-pay | Admitting: Family Medicine

## 2012-10-04 VITALS — BP 160/103 | HR 93 | Wt 177.0 lb

## 2012-10-04 DIAGNOSIS — F438 Other reactions to severe stress: Secondary | ICD-10-CM

## 2012-10-04 DIAGNOSIS — F430 Acute stress reaction: Secondary | ICD-10-CM

## 2012-10-04 DIAGNOSIS — I10 Essential (primary) hypertension: Secondary | ICD-10-CM

## 2012-10-04 DIAGNOSIS — F4389 Other reactions to severe stress: Secondary | ICD-10-CM

## 2012-10-04 MED ORDER — CLONAZEPAM 0.5 MG PO TABS: ORAL_TABLET | ORAL | Status: DC

## 2012-10-04 MED ORDER — METOPROLOL SUCCINATE ER 100 MG PO TB24: 100.0000 mg | ORAL_TABLET | Freq: Every day | ORAL | Status: DC

## 2012-10-04 MED ORDER — CITALOPRAM HYDROBROMIDE 40 MG PO TABS: 40.0000 mg | ORAL_TABLET | Freq: Every day | ORAL | Status: DC

## 2012-10-04 NOTE — Progress Notes (Signed)
  Subjective:    Patient ID: Alicia Steele, female    DOB: [birth year], 47 y.o.   MRN: [id number]  HPI She says she is doing some better. Still struggling with the loss of hre long time partner. Sleep is fair. Has been using the xanax  At bedtime.  She has good supportive frineds. She's tolerating the 40 mg of citalopram well without any side effects or problems or palpitations.  Hypertension-  Pt denies chest pain, SOB, dizziness, or heart palpitations.  Taking meds as directed w/o problems.  Denies medication side effects.     Review of Systems     Objective:   Physical Exam  Constitutional: She is oriented to person, place, and time. She appears well-developed and well-nourished.  HENT:  Head: Normocephalic and atraumatic.  Cardiovascular: Normal rate, regular rhythm and normal heart sounds.   Pulmonary/Chest: Effort normal and breath sounds normal.  Neurological: She is alert and oriented to person, place, and time.  Skin: Skin is warm and dry.  Psychiatric: She has a normal mood and affect. Her behavior is normal.          Assessment & Plan:  Acute situational depression - GAD-7 score of 11 today. Overall she is doing somewhat better. She's happy with current dose on her medication. Offered grieving counseling if she would like. She says right now she has good support in her life and is okay with that but will think about it. Followup in one month.  HTN-  Uncontrolled.weill increase the metoprolol 200 mg. Followup in one month to recheck blood pressure.Marland Kitchen

## 2012-10-16 ENCOUNTER — Other Ambulatory Visit: Payer: Self-pay | Admitting: Family Medicine

## 2013-01-21 ENCOUNTER — Other Ambulatory Visit: Payer: Self-pay | Admitting: Family Medicine

## 2013-02-16 ENCOUNTER — Other Ambulatory Visit: Payer: Self-pay | Admitting: Family Medicine

## 2013-03-01 ENCOUNTER — Other Ambulatory Visit: Payer: Self-pay | Admitting: Family Medicine

## 2013-03-08 ENCOUNTER — Other Ambulatory Visit: Payer: Self-pay | Admitting: *Deleted

## 2013-03-08 ENCOUNTER — Telehealth: Payer: Self-pay | Admitting: *Deleted

## 2013-03-08 DIAGNOSIS — F430 Acute stress reaction: Secondary | ICD-10-CM

## 2013-03-08 MED ORDER — TRIAMTERENE-HCTZ 37.5-25 MG PO CAPS: ORAL_CAPSULE | ORAL | Status: DC

## 2013-03-08 MED ORDER — METOPROLOL SUCCINATE ER 200 MG PO TB24: ORAL_TABLET | ORAL | Status: DC

## 2013-03-08 MED ORDER — CLONAZEPAM 0.5 MG PO TABS: ORAL_TABLET | ORAL | Status: DC

## 2013-03-08 MED ORDER — CITALOPRAM HYDROBROMIDE 40 MG PO TABS: 40.0000 mg | ORAL_TABLET | Freq: Every day | ORAL | Status: DC

## 2013-03-08 MED ORDER — METFORMIN HCL 500 MG PO TABS: ORAL_TABLET | ORAL | Status: DC
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
## 2013-04-13 ENCOUNTER — Telehealth: Payer: Self-pay | Admitting: *Deleted

## 2013-04-13 MED ORDER — TRIAMTERENE-HCTZ 37.5-25 MG PO CAPS: ORAL_CAPSULE | ORAL | Status: DC

## 2013-04-13 NOTE — Telephone Encounter (Signed)
Pt called and stated that she noticed that the metoprolol is 200 mg instead of 100 mg. I informed pt that she will need to cut this in half. She c/o being sleepy I told her that this was due to the amount of medication she was taking. Pt voiced understanding and agreed.Audelia Hives [place]

## 2013-04-19 ENCOUNTER — Telehealth: Payer: Self-pay | Admitting: Family Medicine

## 2013-04-19 ENCOUNTER — Ambulatory Visit: Payer: Commercial Indemnity | Admitting: Family Medicine

## 2013-04-19 DIAGNOSIS — F430 Acute stress reaction: Secondary | ICD-10-CM

## 2013-04-19 MED ORDER — CITALOPRAM HYDROBROMIDE 40 MG PO TABS: 40.0000 mg | ORAL_TABLET | Freq: Every day | ORAL | Status: DC

## 2013-04-19 NOTE — Telephone Encounter (Signed)
Pt called [place] back to reschedule her appt with Dr. Madilyn Fireman. She will run out of Citolopram by the time of her 2/16 appt.

## 2013-04-19 NOTE — Telephone Encounter (Signed)
rx sent.Alicia Steele  

## 2013-04-25 ENCOUNTER — Ambulatory Visit: Payer: Commercial Indemnity | Admitting: Family Medicine

## 2013-05-02 ENCOUNTER — Ambulatory Visit (INDEPENDENT_AMBULATORY_CARE_PROVIDER_SITE_OTHER): Payer: Commercial Indemnity | Admitting: Family Medicine

## 2013-05-02 ENCOUNTER — Encounter: Payer: Self-pay | Admitting: Family Medicine

## 2013-05-02 VITALS — BP 117/74 | HR 66 | Temp 97.9°F | Wt 185.0 lb

## 2013-05-02 DIAGNOSIS — F4389 Other reactions to severe stress: Secondary | ICD-10-CM

## 2013-05-02 DIAGNOSIS — I10 Essential (primary) hypertension: Secondary | ICD-10-CM

## 2013-05-02 DIAGNOSIS — F438 Other reactions to severe stress: Secondary | ICD-10-CM

## 2013-05-02 DIAGNOSIS — F430 Acute stress reaction: Secondary | ICD-10-CM

## 2013-05-02 DIAGNOSIS — E282 Polycystic ovarian syndrome: Secondary | ICD-10-CM

## 2013-05-02 MED ORDER — CITALOPRAM HYDROBROMIDE 40 MG PO TABS: 40.0000 mg | ORAL_TABLET | Freq: Every day | ORAL | Status: DC

## 2013-05-02 MED ORDER — METOPROLOL TARTRATE 100 MG PO TABS: 100.0000 mg | ORAL_TABLET | Freq: Two times a day (BID) | ORAL | Status: DC

## 2013-05-02 MED ORDER — METFORMIN HCL 500 MG PO TABS: ORAL_TABLET | ORAL | Status: DC

## 2013-05-02 MED ORDER — TRIAMTERENE-HCTZ 37.5-25 MG PO CAPS: ORAL_CAPSULE | ORAL | Status: DC
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
## 2013-09-23 ENCOUNTER — Telehealth: Payer: Self-pay | Admitting: *Deleted

## 2013-09-23 MED ORDER — METOPROLOL TARTRATE 50 MG PO TABS: 50.0000 mg | ORAL_TABLET | Freq: Two times a day (BID) | ORAL | Status: DC

## 2013-09-23 NOTE — Telephone Encounter (Signed)
Called pt and lvm informing her that rx for metoprolol has been changed to 50mg  BID and that she will need to f/u in 3 weeks to see how she is doing on meds.Alicia Steele

## 2013-11-24 ENCOUNTER — Other Ambulatory Visit: Payer: Self-pay | Admitting: Family Medicine

## 2013-12-09 ENCOUNTER — Other Ambulatory Visit: Payer: Self-pay | Admitting: Family Medicine

## 2013-12-14 ENCOUNTER — Ambulatory Visit (INDEPENDENT_AMBULATORY_CARE_PROVIDER_SITE_OTHER): Payer: Self-pay | Admitting: Family Medicine

## 2013-12-14 ENCOUNTER — Encounter: Payer: Self-pay | Admitting: Family Medicine

## 2013-12-14 VITALS — BP 148/96 | HR 52 | Wt 199.0 lb

## 2013-12-14 DIAGNOSIS — E282 Polycystic ovarian syndrome: Secondary | ICD-10-CM

## 2013-12-14 DIAGNOSIS — I10 Essential (primary) hypertension: Secondary | ICD-10-CM

## 2013-12-14 DIAGNOSIS — E785 Hyperlipidemia, unspecified: Secondary | ICD-10-CM

## 2013-12-14 LAB — POCT GLYCOSYLATED HEMOGLOBIN (HGB A1C): Hemoglobin A1C: 5.4

## 2013-12-14 MED ORDER — CITALOPRAM HYDROBROMIDE 40 MG PO TABS
ORAL_TABLET | ORAL | Status: DC
Start: 2013-12-14 — End: 2014-07-19

## 2013-12-14 MED ORDER — METFORMIN HCL 500 MG PO TABS: ORAL_TABLET | ORAL | Status: DC

## 2013-12-14 MED ORDER — TRAMADOL HCL 50 MG PO TABS: 50.0000 mg | ORAL_TABLET | Freq: Three times a day (TID) | ORAL | Status: DC | PRN

## 2013-12-14 MED ORDER — OLMESARTAN-AMLODIPINE-HCTZ 20-5-12.5 MG PO TABS: 1.0000 | ORAL_TABLET | Freq: Every day | ORAL | Status: DC

## 2013-12-14 NOTE — Progress Notes (Signed)
   Subjective:    Patient ID: Alicia Steele, female    DOB: 19-Nov-1965, 48 y.o.   MRN: [id number]  HPI Hypertension- Pt denies chest pain, SOB, dizziness, or heart palpitations.  Taking meds as directed w/o problems.  Denies medication side effects.  She says it make her feel sluggisth. She often forgets her second dose.   Hyperlipidemia - overdue for lipids.   PCOS - need to monitor A1C.  Says she had gained some weight back. She ha lost some initially.    Review of Systems     Objective:   Physical Exam  Constitutional: She is oriented to person, place, and time. She appears well-developed and well-nourished.  HENT:  Head: Normocephalic and atraumatic.  Cardiovascular: Normal rate, regular rhythm and normal heart sounds.   Pulmonary/Chest: Effort normal and breath sounds normal.  Neurological: She is alert and oriented to person, place, and time.  Skin: Skin is warm and dry.  Psychiatric: She has a normal mood and affect. Her behavior is normal.          Assessment & Plan:

## 2013-12-14 NOTE — Assessment & Plan Note (Signed)
Due to recheck lipoids.  

## 2013-12-14 NOTE — Assessment & Plan Note (Signed)
A!C is stable.  F/U in 6 months. Continue to work on diet and exercise and weight losss. encouarged her to get back to the gym.

## 2013-12-14 NOTE — Assessment & Plan Note (Signed)
Uncontrolled.  Will change to Tribenzor since feels fatigued on the metoprolol.  F/U in 1-2 months.

## 2013-12-14 NOTE — Addendum Note (Signed)
Addended by: Teddy Spike on: 12/14/2013 02:25 PM   Modules accepted: Orders

## 2014-01-10 ENCOUNTER — Encounter: Payer: Self-pay | Admitting: Family Medicine

## 2014-01-10 ENCOUNTER — Ambulatory Visit (INDEPENDENT_AMBULATORY_CARE_PROVIDER_SITE_OTHER): Payer: Self-pay | Admitting: Family Medicine

## 2014-01-10 VITALS — BP 114/71 | HR 85 | Temp 98.3°F | Wt 200.0 lb

## 2014-01-10 DIAGNOSIS — I10 Essential (primary) hypertension: Secondary | ICD-10-CM

## 2014-01-10 DIAGNOSIS — J029 Acute pharyngitis, unspecified: Secondary | ICD-10-CM

## 2014-01-10 NOTE — Progress Notes (Signed)
   Subjective:    Patient ID: Alicia Steele, female    DOB: 06-Feb-1966, 48 y.o.   MRN: [id number]  HPI Hypertension- Pt denies chest pain, SOB, dizziness, or heart palpitations.  Taking meds as directed w/o problems.  Denies medication side effects.  We switched her to [place] and off of metoprolol. Says she feels much better.   BP Readings from Last 3 Encounters:  01/10/14 114/71  12/14/13 148/96  05/02/13 117/74      ST x 2 day, no fever, chills.  Maybe some left ear pain.  Took some IBU this AM.     Review of Systems     Objective:   Physical Exam  Constitutional: She is oriented to person, place, and time. She appears well-developed and well-nourished.  HENT:  Head: Normocephalic and atraumatic.  Right Ear: External ear normal.  Left Ear: External ear normal.  Nose: Nose normal.  Mouth/Throat: Oropharynx is clear and moist.  TMs and canals are clear.   Eyes: Conjunctivae and EOM are normal. Pupils are equal, round, and reactive to light.  Neck: Neck supple. No thyromegaly present.  Cardiovascular: Normal rate, regular rhythm and normal heart sounds.   Pulmonary/Chest: Effort normal and breath sounds normal. She has no wheezes.  Lymphadenopathy:    She has no cervical adenopathy.  Neurological: She is alert and oriented to person, place, and time.  Skin: Skin is warm and dry.  Psychiatric: She has a normal mood and affect.          Assessment & Plan:  HTN- well controlled. F/u in 6 months. She will go for labs this AM.    Pharyngitis - likely allergies or post nasal drip.  Recommend trial of zinc lozenges. Call if getting worse or develops a fever.

## 2014-01-11 LAB — LIPID PANEL
Cholesterol: 208 mg/dL — ABNORMAL HIGH (ref 0–200)
HDL: 76 mg/dL (ref >39)
LDL Cholesterol: 96 mg/dL (ref 0–99)
Total CHOL/HDL Ratio: 2.7 Ratio
Triglycerides: 179 mg/dL — ABNORMAL HIGH (ref <150)
VLDL: 36 mg/dL (ref 0–40)

## 2014-01-11 LAB — COMPLETE METABOLIC PANEL WITH GFR
ALBUMIN: 4.1 g/dL (ref 3.5–5.2)
ALK PHOS: 64 U/L (ref 39–117)
ALT: 9 U/L (ref 0–35)
AST: 15 U/L (ref 0–37)
BUN: 8 mg/dL (ref 6–23)
CO2: 25 mEq/L (ref 19–32)
Calcium: 9.5 mg/dL (ref 8.4–10.5)
Chloride: 100 mEq/L (ref 96–112)
Creat: 0.62 mg/dL (ref 0.50–1.10)
GFR, Est African American: >89 mL/min
GFR, Est Non African American: >89 mL/min
Glucose, Bld: 87 mg/dL (ref 70–99)
POTASSIUM: 4.8 meq/L (ref 3.5–5.3)
SODIUM: 136 meq/L (ref 135–145)
Total Bilirubin: 0.5 mg/dL (ref 0.2–1.2)
Total Protein: 7.1 g/dL (ref 6.0–8.3)

## 2014-01-12 ENCOUNTER — Ambulatory Visit (INDEPENDENT_AMBULATORY_CARE_PROVIDER_SITE_OTHER): Payer: Self-pay | Admitting: Family Medicine

## 2014-01-12 ENCOUNTER — Telehealth: Payer: Self-pay

## 2014-01-12 VITALS — BP 117/78 | HR 88 | Temp 97.7°F

## 2014-01-12 DIAGNOSIS — J029 Acute pharyngitis, unspecified: Secondary | ICD-10-CM

## 2014-01-12 LAB — POCT RAPID STREP A (OFFICE): RAPID STREP A SCREEN: NEGATIVE

## 2014-01-12 MED ORDER — FIRST-DUKES MOUTHWASH MT SUSP: 5.0000 mL | Freq: Four times a day (QID) | OROMUCOSAL | Status: DC | PRN

## 2014-01-12 NOTE — Progress Notes (Signed)
   Subjective:    Patient ID: Alicia Steele, female    DOB: May 03, 1965, 48 y.o.   MRN: [id number]  HPI  Alicia Steele complains of sore throat for 3 days, fever, chills and itchy ears for 2 days. Denies cough or runny nose. Denies nasal congestion. She feels worse and she left ear. Has noticed 2 white spots in her throat. She has had a history of tonsillectomy.she has been trying think lozenges but it's not really helping.  Review of Systems     Objective:   Physical Exam        Assessment & Plan:  Sore throat - Rapid strep negative - Throat culture pending. Likely viral. Patient given reassurance. Recommend symptomatic care.we'll call with results once available.  Beatrice Lecher, MD

## 2014-01-12 NOTE — Telephone Encounter (Signed)
Alicia Steele was seen on Tuesday with a sore throat. She reports a worsening of sore throat and has had fever. She would like an antibiotic. Please advise.

## 2014-01-12 NOTE — Telephone Encounter (Signed)
See if can see nurse for strep swab.

## 2014-01-12 NOTE — Telephone Encounter (Signed)
Patient schedule for a strep test on the nurse schedule.

## 2014-01-13 LAB — STREP A DNA PROBE: GASP: NEGATIVE

## 2014-01-19 ENCOUNTER — Other Ambulatory Visit: Payer: Self-pay

## 2014-01-19 ENCOUNTER — Ambulatory Visit (INDEPENDENT_AMBULATORY_CARE_PROVIDER_SITE_OTHER): Payer: Self-pay | Admitting: Family Medicine

## 2014-01-19 DIAGNOSIS — I10 Essential (primary) hypertension: Secondary | ICD-10-CM

## 2014-01-19 MED ORDER — OLMESARTAN-AMLODIPINE-HCTZ 20-5-12.5 MG PO TABS: 1.0000 | ORAL_TABLET | Freq: Every day | ORAL | Status: DC

## 2014-01-19 NOTE — Progress Notes (Signed)
   Subjective:    Patient ID: Alicia Steele, female    DOB: [birth year], 48 y.o.   MRN: [id number]  HPI  Alicia Steele is here for Tribenzor 20mg /5mg /12.5mg  samples.   Review of Systems     Objective:   Physical Exam        Assessment & Plan:  HTN- She was given two different lot numbers. Tribenzor lot [id number] exp 2/17 # 56 and lot [id number] exp 2/17 [place] [id number]

## 2014-02-27 ENCOUNTER — Other Ambulatory Visit: Payer: Self-pay | Admitting: *Deleted

## 2014-02-27 MED ORDER — TRAMADOL HCL 50 MG PO TABS: 50.0000 mg | ORAL_TABLET | Freq: Three times a day (TID) | ORAL | Status: DC | PRN

## 2014-04-17 ENCOUNTER — Telehealth: Payer: Self-pay | Admitting: *Deleted

## 2014-04-17 NOTE — Telephone Encounter (Signed)
Pt called asking for samples of tribenzor. rtn call and informed her that I did not have any samples but could provide a discount card if she is interested.Alicia Steele

## 2014-04-20 ENCOUNTER — Telehealth: Payer: Self-pay | Admitting: Emergency Medicine

## 2014-04-20 NOTE — Telephone Encounter (Signed)
Did she use the coupon cards? Does she have one for this year? Can check and see if have one in sample closet or can call rep if don't have one.

## 2014-04-20 NOTE — Telephone Encounter (Signed)
Patient calling to report the Tribenzor is too expensive at all locations; would you be willing to prescribe another antihypertensive medication?

## 2014-04-21 NOTE — Telephone Encounter (Signed)
Pt given samples of Azor 5/20. Alicia Steele, [name]

## 2014-04-21 NOTE — Telephone Encounter (Signed)
Also pt is self pay so she cannot pay $275 a mo for this med. And she said that this med really works well for her. Alicia Steele, Alicia Steele

## 2014-04-21 NOTE — Telephone Encounter (Signed)
Pt did try the savings cards and the medication was still too expensive. Maryruth Eve, [name]

## 2014-05-31 ENCOUNTER — Ambulatory Visit: Payer: Self-pay | Admitting: Family Medicine

## 2014-07-07 ENCOUNTER — Other Ambulatory Visit: Payer: Self-pay

## 2014-07-07 MED ORDER — AMLODIPINE-OLMESARTAN 5-20 MG PO TABS: 1.0000 | ORAL_TABLET | Freq: Every day | ORAL | Status: DC

## 2014-07-18 ENCOUNTER — Telehealth: Payer: Self-pay | Admitting: *Deleted

## 2014-07-18 NOTE — Telephone Encounter (Signed)
BP on 5/9 was a littl high. We could add generic HCTZ 12.5 mg daily  to her azor.  I can't rember if has taken that before

## 2014-07-18 NOTE — Telephone Encounter (Signed)
Pt called to give update of her home bp readings there were as follows: 5/8: 124/80; 5/9: 141/87; and 5/10: 132/77. She has a f/u appt on 5/17.Alicia Steele

## 2014-07-19 ENCOUNTER — Other Ambulatory Visit: Payer: Self-pay | Admitting: Family Medicine

## 2014-07-19 MED ORDER — CITALOPRAM HYDROBROMIDE 40 MG PO TABS: ORAL_TABLET | ORAL | Status: DC

## 2014-07-19 NOTE — Telephone Encounter (Signed)
She was taking 25mg  of HCTZ

## 2014-07-19 NOTE — Telephone Encounter (Signed)
Pt informed and stated that this was changed because her bp readings were still high this was given to her by her ob/gyn. She stated that the Azor is still expensive because she doesn't have insurance and was given samples to take the last time she was here. She is ok with starting the hctz.Alicia Steele, [name]

## 2014-07-20 MED ORDER — LOSARTAN POTASSIUM-HCTZ 100-25 MG PO TABS: 1.0000 | ORAL_TABLET | Freq: Every day | ORAL | Status: DC

## 2014-07-20 MED ORDER — AMLODIPINE BESYLATE 10 MG PO TABS: 10.0000 mg | ORAL_TABLET | Freq: Every day | ORAL | Status: DC

## 2014-07-20 NOTE — Telephone Encounter (Signed)
Can finish out any Azor she has. Then switch to losartan/HCTZ and amlodpine. Both Rx sent to the pharmacy. These are in place of Axor and should be cheaper.

## 2014-07-20 NOTE — Telephone Encounter (Signed)
lvm informing pt of recommendations. .Alicia Steele  

## 2014-07-25 ENCOUNTER — Ambulatory Visit (INDEPENDENT_AMBULATORY_CARE_PROVIDER_SITE_OTHER): Payer: Self-pay | Admitting: Family Medicine

## 2014-07-25 ENCOUNTER — Encounter: Payer: Self-pay | Admitting: Family Medicine

## 2014-07-25 VITALS — BP 122/85 | HR 81 | Wt 210.0 lb

## 2014-07-25 DIAGNOSIS — I10 Essential (primary) hypertension: Secondary | ICD-10-CM

## 2014-07-25 NOTE — Progress Notes (Signed)
   Subjective:    Patient ID: Alicia Steele, female    DOB: Jun 07, 1965, 49 y.o.   MRN: [id number]  HPI Here for blood pressure follow-up-she called about a week ago because she was unable to afford the Azor and we did switch her to losartan HCTZ and amlodipine separately to try to help reduce cost.  She is doing well on the new regimen.     Review of Systems     Objective:   Physical Exam  Constitutional: She is oriented to person, place, and time. She appears well-developed and well-nourished.  HENT:  Head: Normocephalic and atraumatic.  Cardiovascular: Normal rate, regular rhythm and normal heart sounds.   Pulmonary/Chest: Effort normal and breath sounds normal.  Neurological: She is alert and oriented to person, place, and time.  Skin: Skin is warm and dry.  Psychiatric: She has a normal mood and affect. Her behavior is normal.          Assessment & Plan:  HTN- well controlled.  F/U in 6 months.  Due for BMP.    She's also had some left knee pain that she just wanted to mention today. She fell and injured it. But she says it's actually healing well on its own. Is about 80% better. She was a friend do some bee stings to the knee to help reduce inflammation and feels like it actually has been very helpful. She just wanted to keep me up-to-date. Exam not performed today.

## 2014-08-01 ENCOUNTER — Telehealth: Payer: Self-pay | Admitting: *Deleted

## 2014-08-01 NOTE — Telephone Encounter (Signed)
Pt called and reported that her feet, ankles, and legs were swollen. She did say that last night she ate something that had a lot of salt on it. She hasn't checked her bp today but will once she gets home. She is not in any pain or experiencing any SOB. Advised that she elevate her feet and continue to stay hydrated. Will fwd to pcp for f/u.Alicia Steele

## 2014-08-01 NOTE — Telephone Encounter (Signed)
I agree, eat low salt, check BP. If feels SOB then needs appt or if swelling not resolving over next 3 days.

## 2014-08-01 NOTE — Telephone Encounter (Signed)
Pt informed and she checked her bp it was 116/68. She will continue to monitor and call back if any changes.Audelia Hives [place] \

## 2014-08-03 ENCOUNTER — Telehealth: Payer: Self-pay | Admitting: *Deleted

## 2014-08-03 NOTE — Telephone Encounter (Signed)
Pt called and lvm stating that she is still having some swelling in her legs and ankles. She is not having any SOB.  Will fwd to pcp for advice.Alicia Steele

## 2014-08-04 ENCOUNTER — Telehealth: Payer: Self-pay | Admitting: *Deleted

## 2014-08-04 MED ORDER — FUROSEMIDE 20 MG PO TABS: 20.0000 mg | ORAL_TABLET | Freq: Every day | ORAL | Status: DC | PRN

## 2014-08-04 NOTE — Telephone Encounter (Signed)
Pt notified of rx & recommendations. 

## 2014-08-04 NOTE — Telephone Encounter (Signed)
Please call patient: I will send over a perception for furosemide. I don't want her taking it daily just as needed. I want to see her back in about 6 weeks if she doesn't are to have an appointment so that we can see how often she is needing it.

## 2014-08-04 NOTE — Telephone Encounter (Signed)
Pt calls back today stating that she is still experiencing swelling in her feet & ankles.  She is wanting to know if her medication possibly not strong enough.  She is supposed to go hiking this weekend and feels like the swelling is too bad for that.  Please advise.

## 2014-08-08 NOTE — Telephone Encounter (Signed)
Rx refilled.

## 2014-08-09 ENCOUNTER — Telehealth: Payer: Self-pay | Admitting: *Deleted

## 2014-08-09 NOTE — Telephone Encounter (Addendum)
Pt called and stated that she began taking the lasix that was sent to the pharmacy on Friday. She said that she is still having swelling in both of her ankles and feet. She is drinking plenty of water. She took the med in the afternoon because when she got up she wasn't having any swelling however, as the day progresses the swelling gets worse and goes into her leg. I advised her that she should take the lasix in the morning to get the full effect. She told me that when she took her bp this morning around 8 am it was 117/68 and when she took it again it was 130/74 around 9am pt did check it again before getting off of the phone and it was 126/82. She was wondering if the new medication is causing her to have edema. I told her it could be possible. She said that she has not changed any of her dietary habits, she drinks plenty of water and doesn't do a lot of standing for long periods of time. She did inform me that she will be traveling by plane on 6.11.16 to [place]. Will fwd to pcp for advice.Audelia Hives [place]

## 2014-08-09 NOTE — Telephone Encounter (Signed)
Have her limit her fluids to no more than 65 ounces per day. Also make sure she is eating less than 2000 mg of sodium daily. Any extra can cause her to hold onto extra fluid. She can also get some over-the-counter compression stockings which she can slip on in the morning and this will help make a big difference. Go ahead and have her cut her amlodipine in half. Some people do experience swelling on the 10 mg dose but most people do fine with 5 mg. We will need to monitor her blood pressure carefully since we are backing off on the amlodipine some but for now we can see if this could be contributing to her swelling.

## 2014-08-10 MED ORDER — FUROSEMIDE 20 MG PO TABS: 20.0000 mg | ORAL_TABLET | Freq: Every day | ORAL | Status: DC | PRN

## 2014-08-10 NOTE — Telephone Encounter (Signed)
Pt informed of recommendations. She requested a refill of lasix. rx sent to pharm. She is coming in tomorrow to have labs drawn.Alicia Steele

## 2014-08-12 LAB — BASIC METABOLIC PANEL WITH GFR
BUN: 11 mg/dL (ref 6–23)
CO2: 30 meq/L (ref 19–32)
CREATININE: 0.66 mg/dL (ref 0.50–1.10)
Calcium: 9.2 mg/dL (ref 8.4–10.5)
Chloride: 95 mEq/L — ABNORMAL LOW (ref 96–112)
GFR, Est African American: >89 mL/min
GFR, Est Non African American: >89 mL/min
Glucose, Bld: 99 mg/dL (ref 70–99)
POTASSIUM: 4.5 meq/L (ref 3.5–5.3)
Sodium: 135 mEq/L (ref 135–145)

## 2014-08-16 ENCOUNTER — Other Ambulatory Visit: Payer: Self-pay | Admitting: Family Medicine

## 2014-09-13 ENCOUNTER — Other Ambulatory Visit: Payer: Self-pay | Admitting: Family Medicine

## 2014-11-20 ENCOUNTER — Other Ambulatory Visit: Payer: Self-pay | Admitting: Family Medicine

## 2014-11-21 NOTE — Telephone Encounter (Signed)
F/u due in November

## 2014-12-08 ENCOUNTER — Other Ambulatory Visit: Payer: Self-pay | Admitting: Family Medicine

## 2015-01-25 ENCOUNTER — Other Ambulatory Visit: Payer: Self-pay | Admitting: Family Medicine

## 2015-01-29 ENCOUNTER — Ambulatory Visit (INDEPENDENT_AMBULATORY_CARE_PROVIDER_SITE_OTHER): Payer: Self-pay | Admitting: Family Medicine

## 2015-01-29 ENCOUNTER — Encounter: Payer: Self-pay | Admitting: Family Medicine

## 2015-01-29 VITALS — BP 116/69 | HR 88 | Temp 97.4°F | Resp 18 | Wt 205.7 lb

## 2015-01-29 DIAGNOSIS — I10 Essential (primary) hypertension: Secondary | ICD-10-CM

## 2015-01-29 DIAGNOSIS — R609 Edema, unspecified: Secondary | ICD-10-CM

## 2015-01-29 DIAGNOSIS — M25473 Effusion, unspecified ankle: Secondary | ICD-10-CM

## 2015-01-29 DIAGNOSIS — R7301 Impaired fasting glucose: Secondary | ICD-10-CM

## 2015-01-29 DIAGNOSIS — E282 Polycystic ovarian syndrome: Secondary | ICD-10-CM

## 2015-01-29 MED ORDER — FUROSEMIDE 40 MG PO TABS: 40.0000 mg | ORAL_TABLET | Freq: Every day | ORAL | Status: DC | PRN

## 2015-01-29 MED ORDER — AMLODIPINE BESYLATE 2.5 MG PO TABS: 2.5000 mg | ORAL_TABLET | Freq: Every day | ORAL | Status: DC

## 2015-01-29 NOTE — Progress Notes (Signed)
   Subjective:    Patient ID: Alicia Steele, female    DOB: 09-29-1965, 49 y.o.   MRN: [id number]  HPI Hypertension- Pt denies chest pain, SOB, dizziness, or heart palpitations.  Taking meds as directed w/o problems.  Denies medication side effects.    Ankle edema-Has hd to use her lasix about 6 times in the last 3 weeks.  Notices it more in the evening or after hiking, etc.  she is really made some dietary changes to eliminate salt from her diet. She's done well with this. When she does take the Lasix she has to take 2 to actually get and affect. She wants and if that had to be switched to a 40 mg tab.  Review of Systems     Objective:   Physical Exam  Constitutional: She is oriented to person, place, and time. She appears well-developed and well-nourished.  HENT:  Head: Normocephalic and atraumatic.  Cardiovascular: Normal rate, regular rhythm and normal heart sounds.   Pulmonary/Chest: Effort normal and breath sounds normal.  Musculoskeletal: She exhibits edema.  Trace ankle edema bilaterally.  Neurological: She is alert and oriented to person, place, and time.  Skin: Skin is warm and dry.  Psychiatric: She has a normal mood and affect. Her behavior is normal.          Assessment & Plan:  HTN  - well controlled. Will dec amlodipine.  Monitor blood pressure at home. Otherwise follow-up in 6 months if she's doing well. Due for CMP and fasting lipid panel.  Ankle edema - will dec amlodipine to 2.5mg .  see if this helps so that she's not having to use the Lasix as often. Continue to monitor salt intake.  Impaired fasting glucose/PCOS-due to recheck A1c level.

## 2015-03-30 ENCOUNTER — Other Ambulatory Visit: Payer: Self-pay | Admitting: Family Medicine

## 2015-04-16 ENCOUNTER — Ambulatory Visit (INDEPENDENT_AMBULATORY_CARE_PROVIDER_SITE_OTHER): Payer: Self-pay | Admitting: Family Medicine

## 2015-04-16 ENCOUNTER — Encounter: Payer: Self-pay | Admitting: Family Medicine

## 2015-04-16 VITALS — BP 113/74 | HR 85 | Temp 98.2°F | Wt 203.0 lb

## 2015-04-16 DIAGNOSIS — J019 Acute sinusitis, unspecified: Secondary | ICD-10-CM

## 2015-04-16 DIAGNOSIS — J209 Acute bronchitis, unspecified: Secondary | ICD-10-CM

## 2015-04-16 MED ORDER — AMOXICILLIN-POT CLAVULANATE 875-125 MG PO TABS: 1.0000 | ORAL_TABLET | Freq: Two times a day (BID) | ORAL | Status: DC

## 2015-04-16 NOTE — Progress Notes (Signed)
   Subjective:    Patient ID: Alicia Steele, female    DOB: June 06, 1965, 50 y.o.   MRN: [id number]  HPI 7 days of sinus congestion and excess drainage and mucous. No fever, chills or sweats.  No facial pain or pressure but says she can't seem to get the mucus out of her sinuses. It seems very thick. She's been using some peppermint oil in the shower help open up her sinuses. She's also had a productive cough. She felt a little bit of short of breath and she's heard some intermittent wheezing. She had a fever the first few days that she was ill but not in the last couple of days.  Review of Systems     Objective:   Physical Exam  Constitutional: She is oriented to person, place, and time. She appears well-developed and well-nourished.  HENT:  Head: Normocephalic and atraumatic.  Right Ear: External ear normal.  Left Ear: External ear normal.  Nose: Nose normal.  Mouth/Throat: Oropharynx is clear and moist.  TMs and canals are clear.   Eyes: Conjunctivae and EOM are normal. Pupils are equal, round, and reactive to light.  Neck: Neck supple. No thyromegaly present.  Cardiovascular: Normal rate, regular rhythm and normal heart sounds.   Pulmonary/Chest: Effort normal and breath sounds normal. She has no wheezes.  Lymphadenopathy:    She has no cervical adenopathy.  Neurological: She is alert and oriented to person, place, and time.  Skin: Skin is warm and dry.  Psychiatric: She has a normal mood and affect.          Assessment & Plan:  Acute sinusitis /bronchitis- we'll treat with Augmentin. Call if not better by the end of the week. She says she has some cough syrup left over at home and she could certainly set bedtime if needed. Continue with the peppermint oil in the shower.

## 2015-04-16 NOTE — Patient Instructions (Signed)
Sinusitis, Adult °Sinusitis is redness, soreness, and inflammation of the paranasal sinuses. Paranasal sinuses are air pockets within the bones of your face. They are located beneath your eyes, in the middle of your forehead, and above your eyes. In healthy paranasal sinuses, mucus is able to drain out, and air is able to circulate through them by way of your nose. However, when your paranasal sinuses are inflamed, mucus and air can become trapped. This can allow bacteria and other germs to grow and cause infection. °Sinusitis can develop quickly and last only a short time (acute) or continue over a long period (chronic). Sinusitis that lasts for more than 12 weeks is considered chronic. °CAUSES °Causes of sinusitis include: °· Allergies. °· Structural abnormalities, such as displacement of the cartilage that separates your nostrils (deviated septum), which can decrease the air flow through your nose and sinuses and affect sinus drainage. °· Functional abnormalities, such as when the small hairs (cilia) that line your sinuses and help remove mucus do not work properly or are not present. °SIGNS AND SYMPTOMS °Symptoms of acute and chronic sinusitis are the same. The primary symptoms are pain and pressure around the affected sinuses. Other symptoms include: °· Upper toothache. °· Earache. °· Headache. °· Bad breath. °· Decreased sense of smell and taste. °· A cough, which worsens when you are lying flat. °· Fatigue. °· Fever. °· Thick drainage from your nose, which often is green and may contain pus (purulent). °· Swelling and warmth over the affected sinuses. °DIAGNOSIS °Your health care provider will perform a physical exam. During your exam, your health care provider may perform any of the following to help determine if you have acute sinusitis or chronic sinusitis: °· Look in your nose for signs of abnormal growths in your nostrils (nasal polyps). °· Tap over the affected sinus to check for signs of  infection. °· View the inside of your sinuses using an imaging device that has a light attached (endoscope). °If your health care provider suspects that you have chronic sinusitis, one or more of the following tests may be recommended: °· Allergy tests. °· Nasal culture. A sample of mucus is taken from your nose, sent to a lab, and screened for bacteria. °· Nasal cytology. A sample of mucus is taken from your nose and examined by your health care provider to determine if your sinusitis is related to an allergy. °TREATMENT °Most cases of acute sinusitis are related to a viral infection and will resolve on their own within 10 days. Sometimes, medicines are prescribed to help relieve symptoms of both acute and chronic sinusitis. These may include pain medicines, decongestants, nasal steroid sprays, or saline sprays. °However, for sinusitis related to a bacterial infection, your health care provider will prescribe antibiotic medicines. These are medicines that will help kill the bacteria causing the infection. °Rarely, sinusitis is caused by a fungal infection. In these cases, your health care provider will prescribe antifungal medicine. °For some cases of chronic sinusitis, surgery is needed. Generally, these are cases in which sinusitis recurs more than 3 times per year, despite other treatments. °HOME CARE INSTRUCTIONS °· Drink plenty of water. Water helps thin the mucus so your sinuses can drain more easily. °· Use a humidifier. °· Inhale steam 3-4 times a day (for example, sit in the bathroom with the shower running). °· Apply a warm, moist washcloth to your face 3-4 times a day, or as directed by your health care provider. °· Use saline nasal sprays to help   moisten and clean your sinuses. °· Take medicines only as directed by your health care provider. °· If you were prescribed either an antibiotic or antifungal medicine, finish it all even if you start to feel better. °SEEK IMMEDIATE MEDICAL CARE IF: °· You have  increasing pain or severe headaches. °· You have nausea, vomiting, or drowsiness. °· You have swelling around your face. °· You have vision problems. °· You have a stiff neck. °· You have difficulty breathing. °  °This information is not intended to replace advice given to you by your health care provider. Make sure you discuss any questions you have with your health care provider. °  °Document Released: 02/24/2005 Document Revised: 03/17/2014 Document Reviewed: 03/11/2011 °Elsevier Interactive Patient Education ©2016 [employer] ° °Acute Bronchitis °Bronchitis is inflammation of the airways that extend from the windpipe into the lungs (bronchi). The inflammation often causes mucus to develop. This leads to a cough, which is the most common symptom of bronchitis.  °In acute bronchitis, the condition usually develops suddenly and goes away over time, usually in a couple weeks. Smoking, allergies, and asthma can make bronchitis worse. Repeated episodes of bronchitis may cause further lung problems.  °CAUSES °Acute bronchitis is most often caused by the same virus that causes a cold. The virus can spread from person to person (contagious) through coughing, sneezing, and touching contaminated objects. °SIGNS AND SYMPTOMS  °· Cough.   °· Fever.   °· Coughing up mucus.   °· Body aches.   °· Chest congestion.   °· Chills.   °· Shortness of breath.   °· Sore throat.   °DIAGNOSIS  °Acute bronchitis is usually diagnosed through a physical exam. Your health care provider will also ask you questions about your medical history. Tests, such as chest X-rays, are sometimes done to rule out other conditions.  °TREATMENT  °Acute bronchitis usually goes away in a couple weeks. Oftentimes, no medical treatment is necessary. Medicines are sometimes given for relief of fever or cough. Antibiotic medicines are usually not needed but may be prescribed in certain situations. In some cases, an inhaler may be recommended to help reduce  shortness of breath and control the cough. A cool mist vaporizer may also be used to help thin bronchial secretions and make it easier to clear the chest.  °HOME CARE INSTRUCTIONS °· Get plenty of rest.   °· Drink enough fluids to keep your urine clear or pale yellow (unless you have a medical condition that requires fluid restriction). Increasing fluids may help thin your respiratory secretions (sputum) and reduce chest congestion, and it will prevent dehydration.   °· Take medicines only as directed by your health care provider. °· If you were prescribed an antibiotic medicine, finish it all even if you start to feel better. °· Avoid smoking and secondhand smoke. Exposure to cigarette smoke or irritating chemicals will make bronchitis worse. If you are a smoker, consider using nicotine gum or skin patches to help control withdrawal symptoms. Quitting smoking will help your lungs heal faster.   °· Reduce the chances of another bout of acute bronchitis by washing your hands frequently, avoiding people with cold symptoms, and trying not to touch your hands to your mouth, nose, or eyes.   °· Keep all follow-up visits as directed by your health care provider.   °SEEK MEDICAL CARE IF: °Your symptoms do not improve after 1 week of treatment.  °SEEK IMMEDIATE MEDICAL CARE IF: °· You develop an increased fever or chills.   °· You have chest pain.   °· You have severe shortness of   breath. °· You have bloody sputum.   °· You develop dehydration. °· You faint or repeatedly feel like you are going to pass out. °· You develop repeated vomiting. °· You develop a severe headache. °MAKE SURE YOU:  °· Understand these instructions. °· Will watch your condition. °· Will get help right away if you are not doing well or get worse. °  °This information is not intended to replace advice given to you by your health care provider. Make sure you discuss any questions you have with your health care provider. °  °Document Released:  04/03/2004 Document Revised: 03/17/2014 Document Reviewed: 08/17/2012 °Elsevier Interactive Patient Education ©2016 Elsevier Inc. ° °

## 2015-05-02 ENCOUNTER — Telehealth: Payer: Self-pay | Admitting: Family Medicine

## 2015-05-02 ENCOUNTER — Telehealth: Payer: Self-pay | Admitting: *Deleted

## 2015-05-02 ENCOUNTER — Other Ambulatory Visit: Payer: Self-pay | Admitting: Family Medicine

## 2015-05-02 MED ORDER — LEVOFLOXACIN 500 MG PO TABS: 500.0000 mg | ORAL_TABLET | Freq: Every day | ORAL | Status: DC

## 2015-05-02 NOTE — Telephone Encounter (Signed)
Citalopram refilled for 6 more months.

## 2015-05-02 NOTE — Telephone Encounter (Signed)
Spoke w/Dr. Madilyn Fireman she will send over a new abx and if she doesn't get any better after this she will need to be seen. Pt advised.Alicia Steele

## 2015-05-02 NOTE — Telephone Encounter (Signed)
I called pt to let her know she is due for a f/u appt for her Celexa medication and she informed me that she was here in November for her f/u and she usually comes every 6 months for this. I told her I would let the doctor know. Thanks, [employer]

## 2015-05-02 NOTE — Telephone Encounter (Signed)
Pt called and lvm indicating that she is still experiencing a lot of sinus congestion and wanted to know what she should do. She stated that it hard for her to breath, still has cough (non productive)  and she feels tired from all of this. Maryruth Eve, [name]

## 2015-08-02 ENCOUNTER — Other Ambulatory Visit: Payer: Self-pay | Admitting: Family Medicine

## 2015-08-20 ENCOUNTER — Ambulatory Visit (INDEPENDENT_AMBULATORY_CARE_PROVIDER_SITE_OTHER): Payer: Self-pay | Admitting: Family Medicine

## 2015-08-20 ENCOUNTER — Telehealth: Payer: Self-pay | Admitting: Family Medicine

## 2015-08-20 ENCOUNTER — Encounter: Payer: Self-pay | Admitting: Family Medicine

## 2015-08-20 VITALS — BP 115/64 | HR 75 | Wt 205.0 lb

## 2015-08-20 DIAGNOSIS — Z0000 Encounter for general adult medical examination without abnormal findings: Secondary | ICD-10-CM

## 2015-08-20 DIAGNOSIS — Z1231 Encounter for screening mammogram for malignant neoplasm of breast: Secondary | ICD-10-CM

## 2015-08-20 DIAGNOSIS — Z114 Encounter for screening for human immunodeficiency virus [HIV]: Secondary | ICD-10-CM

## 2015-08-20 DIAGNOSIS — E282 Polycystic ovarian syndrome: Secondary | ICD-10-CM

## 2015-08-20 LAB — POCT GLYCOSYLATED HEMOGLOBIN (HGB A1C): Hemoglobin A1C: 5.4

## 2015-08-20 MED ORDER — LOSARTAN POTASSIUM-HCTZ 100-25 MG PO TABS: 1.0000 | ORAL_TABLET | Freq: Every day | ORAL | Status: DC

## 2015-08-20 MED ORDER — CITALOPRAM HYDROBROMIDE 40 MG PO TABS: ORAL_TABLET | ORAL | Status: DC

## 2015-08-20 MED ORDER — METFORMIN HCL 500 MG PO TABS: ORAL_TABLET | ORAL | Status: DC

## 2015-08-20 MED ORDER — FUROSEMIDE 40 MG PO TABS: 40.0000 mg | ORAL_TABLET | Freq: Every day | ORAL | Status: DC | PRN

## 2015-08-20 NOTE — Progress Notes (Signed)
  Subjective:     Alicia Steele is a 50 y.o. female and is here for a comprehensive physical exam. The patient reports no problems.  Social History   Social History  . Marital Status: Single    Spouse Name: N/A  . Number of Children: N/A  . Years of Education: N/A   Occupational History  . Not on file.   Social History Main Topics  . Smoking status: Never Smoker   . Smokeless tobacco: Not on file  . Alcohol Use: Yes  . Drug Use: No  . Sexual Activity: Not on file   Other Topics Concern  . Not on file   Social History Narrative   Health Maintenance  Topic Date Due  . HIV Screening  05/05/1981  . PAP SMEAR  05/06/1987  . INFLUENZA VACCINE  01/29/2016 (Originally 10/09/2015)  . TETANUS/TDAP  02/12/2020    The following portions of the patient's history were reviewed and updated as appropriate: allergies, current medications, past family history, past medical history, past social history, past surgical history and problem list.  Review of Systems A comprehensive review of systems was negative.   Objective:    BP 115/64 mmHg  Pulse 75  Wt 205 lb (92.987 kg)  SpO2 99% General appearance: alert, cooperative and appears stated age Head: Normocephalic, without obvious abnormality, atraumatic Eyes: conj clear, EOMi, PEERLA Ears: normal TM's and external ear canals both ears Nose: Nares normal. Septum midline. Mucosa normal. No drainage or sinus tenderness. Throat: lips, mucosa, and tongue normal; teeth and gums normal Neck: no adenopathy, no carotid bruit, no JVD, supple, symmetrical, trachea midline and thyroid not enlarged, symmetric, no tenderness/mass/nodules Back: symmetric, no curvature. ROM normal. No CVA tenderness. Lungs: clear to auscultation bilaterally Breasts: normal appearance, no masses or tenderness, peircing on the left nipple.  Heart: regular rate and rhythm, S1, S2 normal, no murmur, click, rub or gallop Abdomen: soft, non-tender; bowel sounds normal;  no masses,  no organomegaly Extremities: extremities normal, atraumatic, no cyanosis or edema Pulses: 2+ and symmetric Skin: Skin color, texture, turgor normal. No rashes or lesions Lymph nodes: Cervical, supraclavicular, and axillary nodes normal. Neurologic: Alert and oriented X 3, normal strength and tone. Normal symmetric reflexes. Normal coordination and gait    Assessment:    Healthy female exam.      Plan:     See After Visit Summary for Counseling Recommendations  Keep up a regular exercise program and make sure you are eating a healthy diet Try to eat 4 servings of dairy a day, or if you are lactose intolerant take a calcium with vitamin D daily.  Your vaccines are up to date.   Will call for last pap smear with Dr.Stanbough at [facility].

## 2015-08-20 NOTE — Telephone Encounter (Signed)
Call pt: We did receive a copy of her Pap smear. It was performed in March 2014. She does not need a repeat until March 2019.

## 2015-08-21 NOTE — Telephone Encounter (Signed)
Called and informed pt that we received her pap.Alicia Steele

## 2015-08-24 ENCOUNTER — Other Ambulatory Visit: Payer: Self-pay

## 2015-08-24 DIAGNOSIS — R928 Other abnormal and inconclusive findings on diagnostic imaging of breast: Secondary | ICD-10-CM

## 2015-09-03 ENCOUNTER — Other Ambulatory Visit: Payer: Self-pay | Admitting: Family Medicine

## 2015-09-03 DIAGNOSIS — Z1231 Encounter for screening mammogram for malignant neoplasm of breast: Secondary | ICD-10-CM

## 2015-09-05 ENCOUNTER — Other Ambulatory Visit: Payer: Self-pay | Admitting: Family Medicine

## 2015-09-05 DIAGNOSIS — R921 Mammographic calcification found on diagnostic imaging of breast: Secondary | ICD-10-CM

## 2015-09-20 ENCOUNTER — Encounter: Payer: Self-pay | Admitting: Family Medicine

## 2015-10-22 ENCOUNTER — Other Ambulatory Visit: Payer: Self-pay | Admitting: Family Medicine

## 2015-10-22 DIAGNOSIS — R921 Mammographic calcification found on diagnostic imaging of breast: Secondary | ICD-10-CM

## 2015-10-22 DIAGNOSIS — Z0000 Encounter for general adult medical examination without abnormal findings: Secondary | ICD-10-CM

## 2015-10-22 NOTE — Progress Notes (Signed)
She come in came in requesting to have her thyroid level checked. Will add TSH to her labs.

## 2015-10-23 ENCOUNTER — Telehealth: Payer: Self-pay | Admitting: *Deleted

## 2015-10-23 LAB — COMPLETE METABOLIC PANEL WITH GFR
ALT: 13 U/L (ref 6–29)
AST: 16 U/L (ref 10–35)
Albumin: 4.3 g/dL (ref 3.6–5.1)
Alkaline Phosphatase: 67 U/L (ref 33–115)
BILIRUBIN TOTAL: 0.5 mg/dL (ref 0.2–1.2)
BUN: 10 mg/dL (ref 7–25)
CHLORIDE: 97 mmol/L — AB (ref 98–110)
CO2: 30 mmol/L (ref 20–31)
Calcium: 9.7 mg/dL (ref 8.6–10.2)
Creat: 0.72 mg/dL (ref 0.50–1.10)
GFR, Est African American: >89 mL/min (ref >=60)
GLUCOSE: 95 mg/dL (ref 65–99)
POTASSIUM: 4.2 mmol/L (ref 3.5–5.3)
SODIUM: 138 mmol/L (ref 135–146)
TOTAL PROTEIN: 7.3 g/dL (ref 6.1–8.1)

## 2015-10-23 LAB — TSH: TSH: 2.83 mIU/L

## 2015-10-23 LAB — LIPID PANEL
CHOL/HDL RATIO: 3.3 ratio (ref <=5.0)
Cholesterol: 271 mg/dL — ABNORMAL HIGH (ref 125–200)
HDL: 83 mg/dL (ref >=46)
LDL CALC: 140 mg/dL — AB (ref <130)
Triglycerides: 241 mg/dL — ABNORMAL HIGH (ref <150)
VLDL: 48 mg/dL — AB (ref <30)

## 2015-10-23 LAB — HIV ANTIBODY (ROUTINE TESTING W REFLEX): HIV: NONREACTIVE

## 2015-10-23 NOTE — Telephone Encounter (Addendum)
-----   Message from Hali Marry, MD sent at 10/23/2015  7:48 AM EDT ----- Call patient: Thyroid is normal. Metabolic panel including liver and kidney function is stable. Cholesterol is way up compared to last year. Just encourage her to get back on track with diet and exercise and consider rechecking cholesterol in 6 months.  Left message on patients mobile VM labs normal exception cholesterol is elevated. Encouraged diet and exercise and re-check in 6 months. Call back as needed.

## 2015-10-30 ENCOUNTER — Other Ambulatory Visit: Payer: Self-pay | Admitting: Family Medicine

## 2016-02-09 ENCOUNTER — Other Ambulatory Visit: Payer: Self-pay | Admitting: Family Medicine

## 2016-03-06 ENCOUNTER — Other Ambulatory Visit: Payer: Self-pay | Admitting: Family Medicine

## 2016-03-12 ENCOUNTER — Other Ambulatory Visit: Payer: Self-pay | Admitting: Family Medicine

## 2016-04-18 ENCOUNTER — Other Ambulatory Visit: Payer: Self-pay | Admitting: Family Medicine

## 2016-05-07 ENCOUNTER — Telehealth: Payer: Self-pay | Admitting: Family Medicine

## 2016-05-07 NOTE — Telephone Encounter (Signed)
Patient called in to schedule a follow up appointment. While on the phone, she stated that she was charged by the lab for an HIV test that she had asked you to take off the lab orders. She said she was self-pay and felt she did not need the HIV test. The lab has sent her account to collections. I see in her chart that the test was done, but she insisted that she asked you to take it off. Is there anything we can do from this end? Thanks!

## 2016-05-08 NOTE — Telephone Encounter (Signed)
OK, will send to Abigail Butts and see if we can take care of cost of the HIV. Please let pt will route to our office manager.

## 2016-05-12 ENCOUNTER — Encounter: Payer: Self-pay | Admitting: Family Medicine

## 2016-05-12 ENCOUNTER — Ambulatory Visit (INDEPENDENT_AMBULATORY_CARE_PROVIDER_SITE_OTHER): Payer: Self-pay | Admitting: Family Medicine

## 2016-05-12 VITALS — BP 127/76 | HR 87 | Wt 208.0 lb

## 2016-05-12 DIAGNOSIS — E282 Polycystic ovarian syndrome: Secondary | ICD-10-CM

## 2016-05-12 DIAGNOSIS — I10 Essential (primary) hypertension: Secondary | ICD-10-CM

## 2016-05-12 MED ORDER — LOSARTAN POTASSIUM-HCTZ 100-25 MG PO TABS: 1.0000 | ORAL_TABLET | Freq: Every day | ORAL | 1 refills | Status: DC

## 2016-05-12 MED ORDER — AMLODIPINE BESYLATE 2.5 MG PO TABS: 2.5000 mg | ORAL_TABLET | Freq: Every day | ORAL | 1 refills | Status: DC

## 2016-05-12 MED ORDER — CITALOPRAM HYDROBROMIDE 40 MG PO TABS: ORAL_TABLET | ORAL | 1 refills | Status: DC

## 2016-05-12 NOTE — Progress Notes (Signed)
Subjective:    CC: HTN, PCOS  HPI: Hypertension- Pt denies chest pain, SOB, dizziness, or heart palpitations.  Taking meds as directed w/o problems.  Denies medication side effects.    PCOS - She is currently on metformin 3 times a day.    Objective:    General: Well Developed, well nourished, and in no acute distress.  Neuro: Alert and oriented x3, extra-ocular muscles intact, sensation grossly intact.  HEENT: Normocephalic, atraumatic  Skin: Warm and dry, no rashes. Cardiac: Regular rate and rhythm, no murmurs rubs or gallops, no lower extremity edema.  Respiratory: Clear to auscultation bilaterally. Not using accessory muscles, speaking in full sentences.   Impression and Recommendations:    HTN  - Well controlled. Continue current regimen. Follow up in  6 months.   PCOS - stable on metformin.F/U in 6 months for A1C    Mod - PHQ-2 score of 0.  Ok to cut tab in half and see if works well.   Flu vaccine - Declined.   Colonoscopy - discussed cologuard.   Pap smear - she is ok.

## 2016-05-22 NOTE — Telephone Encounter (Signed)
Dr. Madilyn Fireman, I called and spoke with Candy, our Account Manager for [facility].  She is going to go into the patient's account today and remove the HIV charge. The patient will not be responsible for the HIV test.

## 2016-05-22 NOTE — Telephone Encounter (Signed)
Left VM advising Pt.  

## 2016-05-22 NOTE — Telephone Encounter (Signed)
Please call patient and let her know that they're going to remove the charge for the HIV. I spoke with our manager and her account manager for request. This should be corrected within the next few days.

## 2016-10-24 ENCOUNTER — Other Ambulatory Visit: Payer: Self-pay | Admitting: Family Medicine

## 2016-11-18 ENCOUNTER — Other Ambulatory Visit: Payer: Self-pay | Admitting: Family Medicine

## 2016-11-20 ENCOUNTER — Telehealth: Payer: Self-pay | Admitting: Family Medicine

## 2016-11-20 NOTE — Telephone Encounter (Signed)
Called pt and left a message for her to schedule a F/u on BP with Dr. Madilyn Fireman

## 2016-11-28 ENCOUNTER — Other Ambulatory Visit: Payer: Self-pay | Admitting: Family Medicine

## 2016-11-29 ENCOUNTER — Other Ambulatory Visit: Payer: Self-pay | Admitting: Family Medicine

## 2016-12-16 ENCOUNTER — Ambulatory Visit: Payer: Self-pay | Admitting: Family Medicine

## 2016-12-25 ENCOUNTER — Ambulatory Visit (INDEPENDENT_AMBULATORY_CARE_PROVIDER_SITE_OTHER): Payer: Self-pay | Admitting: Family Medicine

## 2016-12-25 ENCOUNTER — Encounter: Payer: Self-pay | Admitting: Family Medicine

## 2016-12-25 VITALS — BP 129/78 | HR 81 | Ht 66.0 in | Wt 209.0 lb

## 2016-12-25 DIAGNOSIS — I10 Essential (primary) hypertension: Secondary | ICD-10-CM

## 2016-12-25 DIAGNOSIS — E282 Polycystic ovarian syndrome: Secondary | ICD-10-CM

## 2016-12-25 DIAGNOSIS — F0630 Mood disorder due to known physiological condition, unspecified: Secondary | ICD-10-CM

## 2016-12-25 LAB — POCT GLYCOSYLATED HEMOGLOBIN (HGB A1C): Hemoglobin A1C: 5.6

## 2016-12-25 NOTE — Progress Notes (Signed)
Subjective:    CC: HTN, Mood D/U, PCOS  HPI:  Hypertension- Pt denies chest pain, SOB, dizziness, or heart palpitations.  Taking meds as directed w/o problems.  Denies medication side effects.    Mood disorder-currently on citalopram 40 mg daily.  She is taking half a tab. Once she cut the tab in half she felt a little better. On the whole tab she was feeling a little extra sleepy and sedated during the daytime. She feels like a half tabs working great for her mood.  PCO S-currently on metformin 5 mg. she does get electrolysis for hair removal and in fact her electrolysis provider actually noticed a abnormal skin lesion on her nose and encouraged her to get in with dermatology. It was biopsied and came back suspicious. [facility] actually [facility] with [facility]. Fortunately it turned out not to be melanoma.  Past medical history, Surgical history, Family history not pertinant except as noted below, Social history, Allergies, and medications have been entered into the medical record, reviewed, and corrections made.   Review of Systems: No fevers, chills, night sweats, weight loss, chest pain, or shortness of breath.   Objective:    General: Well Developed, well nourished, and in no acute distress.  Neuro: Alert and oriented x3, extra-ocular muscles intact, sensation grossly intact.  HEENT: Normocephalic, atraumatic  Skin: Warm and dry, no rashes. Cardiac: Regular rate and rhythm, no murmurs rubs or gallops, no lower extremity edema.  Respiratory: Clear to auscultation bilaterally. Not using accessory muscles, speaking in full sentences.   Impression and Recommendations:   HTN - Well controlled. Continue current regimen. Follow up in  6 months.   Mood D/O - Continue with half a tablet the citalopram. She would prefer to keep it at the 40s and split which is perfectly fine. Check she feels less sleepy and sedated on the 20 mg.  PCOS - Doing well overall. Currently  on metformin. Hemoglobin A1c of 5.6 today which looks great. Encouraged her to continue to work on healthy diet and regular exercise. She's interested in the daily diet which I think would be perfectly fine.

## 2016-12-31 ENCOUNTER — Other Ambulatory Visit: Payer: Self-pay | Admitting: Family Medicine

## 2017-01-01 ENCOUNTER — Other Ambulatory Visit: Payer: Self-pay | Admitting: Family Medicine

## 2017-01-24 ENCOUNTER — Other Ambulatory Visit: Payer: Self-pay | Admitting: Family Medicine

## 2017-02-13 ENCOUNTER — Other Ambulatory Visit: Payer: Self-pay | Admitting: Family Medicine

## 2017-05-22 ENCOUNTER — Other Ambulatory Visit: Payer: Self-pay | Admitting: Family Medicine

## 2017-07-01 ENCOUNTER — Other Ambulatory Visit: Payer: Self-pay | Admitting: Family Medicine

## 2017-08-20 ENCOUNTER — Other Ambulatory Visit: Payer: Self-pay | Admitting: Family Medicine

## 2017-08-20 ENCOUNTER — Other Ambulatory Visit: Payer: Self-pay | Admitting: Osteopathic Medicine

## 2017-08-25 ENCOUNTER — Other Ambulatory Visit: Payer: Self-pay | Admitting: *Deleted

## 2017-08-25 MED ORDER — AMLODIPINE BESYLATE 2.5 MG PO TABS: 2.5000 mg | ORAL_TABLET | Freq: Every day | ORAL | 0 refills | Status: DC

## 2017-09-21 ENCOUNTER — Other Ambulatory Visit: Payer: Self-pay | Admitting: Family Medicine

## 2017-10-09 ENCOUNTER — Other Ambulatory Visit: Payer: Self-pay | Admitting: Family Medicine

## 2017-10-28 ENCOUNTER — Ambulatory Visit (INDEPENDENT_AMBULATORY_CARE_PROVIDER_SITE_OTHER): Payer: Self-pay | Admitting: Family Medicine

## 2017-10-28 ENCOUNTER — Encounter: Payer: Self-pay | Admitting: Family Medicine

## 2017-10-28 VITALS — BP 128/71 | HR 71 | Ht 66.0 in | Wt 204.0 lb

## 2017-10-28 DIAGNOSIS — F0630 Mood disorder due to known physiological condition, unspecified: Secondary | ICD-10-CM

## 2017-10-28 DIAGNOSIS — R928 Other abnormal and inconclusive findings on diagnostic imaging of breast: Secondary | ICD-10-CM

## 2017-10-28 DIAGNOSIS — I10 Essential (primary) hypertension: Secondary | ICD-10-CM

## 2017-10-28 DIAGNOSIS — E282 Polycystic ovarian syndrome: Secondary | ICD-10-CM

## 2017-10-28 MED ORDER — AMLODIPINE BESYLATE 2.5 MG PO TABS: 2.5000 mg | ORAL_TABLET | Freq: Every day | ORAL | 2 refills | Status: DC

## 2017-10-28 MED ORDER — METFORMIN HCL ER 500 MG PO TB24: 1000.0000 mg | ORAL_TABLET | Freq: Every day | ORAL | 3 refills | Status: DC

## 2017-10-28 MED ORDER — LOSARTAN POTASSIUM-HCTZ 100-25 MG PO TABS: 1.0000 | ORAL_TABLET | Freq: Every day | ORAL | 2 refills | Status: DC

## 2017-10-28 MED ORDER — CITALOPRAM HYDROBROMIDE 40 MG PO TABS: 40.0000 mg | ORAL_TABLET | Freq: Every day | ORAL | 2 refills | Status: DC

## 2017-10-28 NOTE — Progress Notes (Signed)
Subjective:    CC: BP and mood  HPI:  Hypertension- Pt denies chest pain, SOB, dizziness, or heart palpitations.  Taking meds as directed w/o problems.  Denies medication side effects.    Mood D/O - she is on citalopram but overall she is actually really doing well.  She is dating again and her name is Conservation officer, historic buildings.  Her adopted son is also doing great.  Is been on the [employer] honor roll at school he is now 23.  She does not want to discontinue her medication.  PCOS - she is doing well on metformin.  She often mises her mid day dose.  She would like to try the extended release.  Past medical history, Surgical history, Family history not pertinant except as noted below, Social history, Allergies, and medications have been entered into the medical record, reviewed, and corrections made.   Review of Systems: No fevers, chills, night sweats, weight loss, chest pain, or shortness of breath.   Objective:    General: Well Developed, well nourished, and in no acute distress.  Neuro: Alert and oriented x3, extra-ocular muscles intact, sensation grossly intact.  HEENT: Normocephalic, atraumatic  Skin: Warm and dry, no rashes. Cardiac: Regular rate and rhythm, no murmurs rubs or gallops, no lower extremity edema.  Respiratory: Clear to auscultation bilaterally. Not using accessory muscles, speaking in full sentences.   Impression and Recommendations:    HTN - Well controlled. Continue current regimen. Follow up in  6 months.    Mood D/O - continue current regimen. She doesn't want to taper medication.   PCOS - change to metformin ER. Monitor A1C.    Cancer screening-reminded that she is due.  She would prefer to do Cologuard and declines doing an actual colonoscopy.  Mammogram order placed and strongly encouraged her to get that done.  Reminded her that she is about 5 years overdue.

## 2017-11-02 ENCOUNTER — Other Ambulatory Visit: Payer: Self-pay | Admitting: Family Medicine

## 2017-11-02 DIAGNOSIS — R928 Other abnormal and inconclusive findings on diagnostic imaging of breast: Secondary | ICD-10-CM

## 2017-11-05 ENCOUNTER — Other Ambulatory Visit: Payer: Self-pay | Admitting: Family Medicine

## 2017-11-05 DIAGNOSIS — R921 Mammographic calcification found on diagnostic imaging of breast: Secondary | ICD-10-CM

## 2017-11-16 ENCOUNTER — Telehealth (HOSPITAL_COMMUNITY): Payer: Self-pay | Admitting: *Deleted

## 2017-11-16 NOTE — Telephone Encounter (Signed)
Telephoned patient at home number and left message to return call to [facility] 

## 2017-11-20 ENCOUNTER — Telehealth (HOSPITAL_COMMUNITY): Payer: Self-pay

## 2017-11-20 NOTE — Telephone Encounter (Signed)
TRIED TO CALL I DID LEAVE A MESSAGE

## 2018-04-16 LAB — COMPLETE METABOLIC PANEL WITH GFR
AG Ratio: 1.6 (calc) (ref 1.0–2.5)
ALT: 20 U/L (ref 6–29)
AST: 20 U/L (ref 10–35)
Albumin: 4.4 g/dL (ref 3.6–5.1)
Alkaline phosphatase (APISO): 66 U/L (ref 37–153)
BUN: 11 mg/dL (ref 7–25)
CALCIUM: 10.1 mg/dL (ref 8.6–10.4)
CO2: 29 mmol/L (ref 20–32)
CREATININE: 0.66 mg/dL (ref 0.50–1.05)
Chloride: 99 mmol/L (ref 98–110)
GFR, EST NON AFRICAN AMERICAN: 102 mL/min/{1.73_m2} (ref >=60)
GFR, Est African American: 119 mL/min/{1.73_m2} (ref >=60)
GLOBULIN: 2.8 g/dL (ref 1.9–3.7)
GLUCOSE: 98 mg/dL (ref 65–99)
Potassium: 5.2 mmol/L (ref 3.5–5.3)
SODIUM: 138 mmol/L (ref 135–146)
Total Bilirubin: 0.2 mg/dL (ref 0.2–1.2)
Total Protein: 7.2 g/dL (ref 6.1–8.1)

## 2018-04-16 LAB — LIPID PANEL
CHOL/HDL RATIO: 3.7 (calc) (ref <5.0)
Cholesterol: 296 mg/dL — ABNORMAL HIGH (ref <200)
HDL: 81 mg/dL (ref >=50)
LDL CHOLESTEROL (CALC): 174 mg/dL — AB
NON-HDL CHOLESTEROL (CALC): 215 mg/dL — AB (ref <130)
Triglycerides: 244 mg/dL — ABNORMAL HIGH (ref <150)

## 2018-04-16 LAB — TSH: TSH: 6.91 m[IU]/L — AB

## 2018-04-16 LAB — CBC
HCT: 39.4 % (ref 35.0–45.0)
Hemoglobin: 13.3 g/dL (ref 11.7–15.5)
MCH: 30.9 pg (ref 27.0–33.0)
MCHC: 33.8 g/dL (ref 32.0–36.0)
MCV: 91.4 fL (ref 80.0–100.0)
MPV: 10.2 fL (ref 7.5–12.5)
PLATELETS: 462 10*3/uL — AB (ref 140–400)
RBC: 4.31 10*6/uL (ref 3.80–5.10)
RDW: 12.7 % (ref 11.0–15.0)
WBC: 6.1 10*3/uL (ref 3.8–10.8)

## 2018-04-16 LAB — T4, FREE: Free T4: 1.1 ng/dL (ref 0.8–1.8)

## 2018-04-20 ENCOUNTER — Other Ambulatory Visit: Payer: Self-pay | Admitting: *Deleted

## 2018-05-11 ENCOUNTER — Other Ambulatory Visit: Payer: Self-pay | Admitting: Family Medicine

## 2018-06-03 ENCOUNTER — Encounter: Payer: Self-pay | Admitting: Family Medicine

## 2018-06-03 ENCOUNTER — Ambulatory Visit (INDEPENDENT_AMBULATORY_CARE_PROVIDER_SITE_OTHER): Payer: Self-pay | Admitting: Family Medicine

## 2018-06-03 ENCOUNTER — Telehealth: Payer: Self-pay | Admitting: *Deleted

## 2018-06-03 VITALS — BP 108/70 | HR 77 | Temp 97.9°F | Ht 66.0 in | Wt 200.0 lb

## 2018-06-03 DIAGNOSIS — J018 Other acute sinusitis: Secondary | ICD-10-CM

## 2018-06-03 DIAGNOSIS — J019 Acute sinusitis, unspecified: Secondary | ICD-10-CM

## 2018-06-03 DIAGNOSIS — J309 Allergic rhinitis, unspecified: Secondary | ICD-10-CM | POA: Insufficient documentation

## 2018-06-03 DIAGNOSIS — J301 Allergic rhinitis due to pollen: Secondary | ICD-10-CM

## 2018-06-03 DIAGNOSIS — J040 Acute laryngitis: Secondary | ICD-10-CM

## 2018-06-03 MED ORDER — FLUCONAZOLE 150 MG PO TABS: 150.0000 mg | ORAL_TABLET | Freq: Once | ORAL | 0 refills | Status: AC

## 2018-06-03 MED ORDER — AMOXICILLIN-POT CLAVULANATE 875-125 MG PO TABS: 1.0000 | ORAL_TABLET | Freq: Two times a day (BID) | ORAL | 0 refills | Status: DC

## 2018-06-03 NOTE — Progress Notes (Signed)
Virtual Visit via Telephone Note  I connected with Alicia Steele on 06/03/18 at  3:40 PM EDT by telephone and verified that I am speaking with the correct person using two identifiers.   I discussed the limitations, risks, security and privacy concerns of performing an evaluation and management service by telephone and the availability of in person appointments. I also discussed with the patient that there may be a patient responsible charge related to this service. The patient expressed understanding and agreed to proceed.   Subjective:    CC: Sore throat  HPI:  Pt reports that on Friday ( x 7 days) she began to experience a scratchy throat. She was outside a lot around the pollen. Lost her voice the next day.  She said that she also ran a low grade fever of has been blowing out green mucous from her nose.  She has been cough. No SOB. She doesn't have any facial pain/pressure. She has been taking mucinex-d for a cough. 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 + laryngitis.  She does have some seasonal allergies. She felt some better yesterday but then last night felt worse.  Then took her temp last night and it was 99.3.  She takes Aller-tec (Zyrtec). No GI sxs.  No N/V/D. No body aches.  No ear pain, but iching.   Past medical history, Surgical history, Family history not pertinant except as noted below, Social history, Allergies, and medications have been entered into the medical record, reviewed, and corrections made.   Review of Systems: No fevers, chills, night sweats, weight loss, chest pain, or shortness of breath.   Objective:    General: Voice is very raspy.  She is coughing and it sounds productive.  No increased work of breathing.  No respiratory distress.  Alert and oriented.  Good judgment.   Impression and Recommendations:   Acute sinusitis - Likely bacterial on to of AR.  Will Augmentin. Call if not better.  Monitor  for increased or worsening sxs.  Continue Zyrtec.  Call if fever continues or SOB.  Reports that she usually gets a yeast infection with antibiotics so did send over prescription for Diflucan as well.  AR - start you flonase.  Recommend trail of saline rinse.  Continue zytec.     I discussed the assessment and treatment plan with the patient. The patient was provided an opportunity to ask questions and all were answered. The patient agreed with the plan and demonstrated an understanding of the instructions.   The patient was advised to call back or seek an in-person evaluation if the symptoms worsen or if the condition fails to improve as anticipated.  I provided 15 minutes of non-face-to-face time during this encounter.   Beatrice Lecher, MD

## 2018-06-03 NOTE — Progress Notes (Signed)
Pt reports that on Friday she began to experience a scratchy throat. She said that she also ran a low grade fever of 99.1 and has been blowing out green mucous. She doesn't have any facial pain/pressure. She has been taking mucinex d for a cough. 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 Maryruth Eve, [name], CMA

## 2018-06-03 NOTE — Telephone Encounter (Signed)
99.1 Friday scratchy throat, green mucous, no facial pain or pressure. She has cough taking mucinex d.Elouise Munroe, [place]

## 2018-08-16 ENCOUNTER — Other Ambulatory Visit: Payer: Self-pay | Admitting: Family Medicine

## 2018-08-16 DIAGNOSIS — I10 Essential (primary) hypertension: Secondary | ICD-10-CM

## 2018-08-31 ENCOUNTER — Telehealth: Payer: Self-pay | Admitting: *Deleted

## 2018-08-31 DIAGNOSIS — R921 Mammographic calcification found on diagnostic imaging of breast: Secondary | ICD-10-CM

## 2018-08-31 DIAGNOSIS — Z1159 Encounter for screening for other viral diseases: Secondary | ICD-10-CM

## 2018-08-31 DIAGNOSIS — Z202 Contact with and (suspected) exposure to infections with a predominantly sexual mode of transmission: Secondary | ICD-10-CM

## 2018-08-31 DIAGNOSIS — Z114 Encounter for screening for human immunodeficiency virus [HIV]: Secondary | ICD-10-CM

## 2018-08-31 DIAGNOSIS — R7989 Other specified abnormal findings of blood chemistry: Secondary | ICD-10-CM

## 2018-08-31 DIAGNOSIS — R928 Other abnormal and inconclusive findings on diagnostic imaging of breast: Secondary | ICD-10-CM

## 2018-08-31 NOTE — Telephone Encounter (Signed)
Pt called and asked to get labs done because she found out that her previous partner has Hep C and she would like to get tested.

## 2018-09-06 LAB — HIV ANTIBODY (ROUTINE TESTING W REFLEX): HIV 1&2 Ab, 4th Generation: NONREACTIVE

## 2018-09-06 LAB — THYROID PEROXIDASE ANTIBODIES (TPO) (REFL): Thyroperoxidase Ab SerPl-aCnc: 2 IU/mL (ref <9)

## 2018-09-06 LAB — HEPATITIS PANEL, ACUTE
Hep A IgM: NONREACTIVE
Hep B C IgM: NONREACTIVE
Hepatitis B Surface Ag: NONREACTIVE
Hepatitis C Ab: NONREACTIVE
SIGNAL TO CUT-OFF: 0.01 (ref <1.00)

## 2018-09-06 LAB — RPR: RPR Ser Ql: NONREACTIVE

## 2018-09-06 LAB — TSH+FREE T4: TSH W/REFLEX TO FT4: 1.73 mIU/L

## 2018-09-06 LAB — T3, FREE: T3, Free: 2.5 pg/mL (ref 2.3–4.2)

## 2018-11-12 ENCOUNTER — Other Ambulatory Visit: Payer: Self-pay | Admitting: *Deleted

## 2018-11-12 ENCOUNTER — Other Ambulatory Visit: Payer: Self-pay | Admitting: Family Medicine

## 2018-11-12 DIAGNOSIS — I10 Essential (primary) hypertension: Secondary | ICD-10-CM

## 2018-11-12 DIAGNOSIS — E785 Hyperlipidemia, unspecified: Secondary | ICD-10-CM

## 2018-11-12 DIAGNOSIS — F0630 Mood disorder due to known physiological condition, unspecified: Secondary | ICD-10-CM

## 2018-11-12 DIAGNOSIS — E282 Polycystic ovarian syndrome: Secondary | ICD-10-CM

## 2018-11-26 ENCOUNTER — Encounter: Payer: Self-pay | Admitting: Family Medicine

## 2018-11-26 ENCOUNTER — Ambulatory Visit (INDEPENDENT_AMBULATORY_CARE_PROVIDER_SITE_OTHER): Payer: Self-pay | Admitting: Family Medicine

## 2018-11-26 VITALS — BP 113/69 | HR 95 | Temp 98.0°F

## 2018-11-26 DIAGNOSIS — I10 Essential (primary) hypertension: Secondary | ICD-10-CM

## 2018-11-26 DIAGNOSIS — E785 Hyperlipidemia, unspecified: Secondary | ICD-10-CM

## 2018-11-26 DIAGNOSIS — Z124 Encounter for screening for malignant neoplasm of cervix: Secondary | ICD-10-CM

## 2018-11-26 DIAGNOSIS — E282 Polycystic ovarian syndrome: Secondary | ICD-10-CM

## 2018-11-26 DIAGNOSIS — F0630 Mood disorder due to known physiological condition, unspecified: Secondary | ICD-10-CM

## 2018-11-26 DIAGNOSIS — Z1239 Encounter for other screening for malignant neoplasm of breast: Secondary | ICD-10-CM

## 2018-11-26 MED ORDER — FUROSEMIDE 40 MG PO TABS: 40.0000 mg | ORAL_TABLET | Freq: Every day | ORAL | 0 refills | Status: DC | PRN

## 2018-11-26 NOTE — Assessment & Plan Note (Signed)
Due to recheck lipids. 

## 2018-11-26 NOTE — Assessment & Plan Note (Signed)
Happy with her current dose of citalopram.  Refills sent for the next 6 months.  Call if any problems or concerns otherwise I will see her back in 6 months.

## 2018-11-26 NOTE — Assessment & Plan Note (Signed)
Due for A1C check.  Continue with metformin

## 2018-11-26 NOTE — Assessment & Plan Note (Signed)
Well controlled. Continue current regimen. Follow up in  6 months.  BP borderline.

## 2018-11-26 NOTE — Progress Notes (Signed)
Virtual Visit via Video Note  I connected with Alicia Steele on 11/26/18 at  2:20 PM EDT by a video enabled telemedicine application and verified that I am speaking with the correct person using two identifiers.   I discussed the limitations of evaluation and management by telemedicine and the availability of in person appointments. The patient expressed understanding and agreed to proceed.  Subjective:    CC: F/U Mood  HPI:  Hypertension- Pt denies chest pain, SOB, dizziness, or heart palpitations.  Taking meds as directed w/o problems.  Denies medication side effects.  Denies any lower extremity swelling.  Follow-up mood disorder-currently on citalopram.  She is been taking the whole 40 mg more recently.  But feels like overall she is actually doing really well she is been busy at work.  Patient remember the exact date of her last Pap smear but says that it was normal and she was able to go 5 years but her OB/GYN has now retired.  She is little behind on getting her mammogram as well.  Has a history of PCOS and does take metformin.  We can follow her A1c periodically and she is due to recheck it again.  Past medical history, Surgical history, Family history not pertinant except as noted below, Social history, Allergies, and medications have been entered into the medical record, reviewed, and corrections made.   Review of Systems: No fevers, chills, night sweats, weight loss, chest pain, or shortness of breath.   Objective:    General: Speaking clearly in complete sentences without any shortness of breath.  Alert and oriented x3.  Normal judgment. No apparent acute distress.    Impression and Recommendations:    HYPERTENSION, BENIGN Well controlled. Continue current regimen. Follow up in  6 months.  BP borderline.   POLYCYSTIC OVARIAN DISEASE Due for A1C check.  Continue with metformin  Hyperlipemia Due to recheck lipids  Mood disorder in conditions classified  elsewhere Happy with her current dose of citalopram.  Refills sent for the next 6 months.  Call if any problems or concerns otherwise I will see her back in 6 months.     Go ahead and place referral to OB/GYN for getting up-to-date Pap smear as well as mammogram.   I discussed the assessment and treatment plan with the patient. The patient was provided an opportunity to ask questions and all were answered. The patient agreed with the plan and demonstrated an understanding of the instructions.   The patient was advised to call back or seek an in-person evaluation if the symptoms worsen or if the condition fails to improve as anticipated.   Beatrice Lecher, MD

## 2019-02-10 ENCOUNTER — Other Ambulatory Visit: Payer: Self-pay | Admitting: Family Medicine

## 2019-02-10 DIAGNOSIS — I10 Essential (primary) hypertension: Secondary | ICD-10-CM

## 2019-05-19 ENCOUNTER — Ambulatory Visit: Payer: Self-pay | Attending: Internal Medicine

## 2019-05-19 ENCOUNTER — Other Ambulatory Visit: Payer: Self-pay | Admitting: Family Medicine

## 2019-05-19 DIAGNOSIS — I10 Essential (primary) hypertension: Secondary | ICD-10-CM

## 2019-05-19 DIAGNOSIS — Z23 Encounter for immunization: Secondary | ICD-10-CM

## 2019-05-19 DIAGNOSIS — F0630 Mood disorder due to known physiological condition, unspecified: Secondary | ICD-10-CM

## 2019-05-19 NOTE — Progress Notes (Signed)
   Covid-19 Vaccination Clinic  Name:  Alicia Steele    MRN: [id number] DOB: 1965-04-20  05/19/2019  [name] was observed post Covid-19 immunization for 15 minutes without incident. She was provided with Vaccine Information Sheet and instruction to access the V-Safe system.   [name] was instructed to call 911 with any severe reactions post vaccine: Marland Kitchen Difficulty breathing  . Swelling of face and throat  . A fast heartbeat  . A bad rash all over body  . Dizziness and weakness   Immunizations Administered    Name Date Dose VIS Date Route   Pfizer COVID-19 Vaccine 05/19/2019 11:01 AM 0.3 mL 02/18/2019 Intramuscular   Manufacturer: [place]   Lot: [id number]   [place]: [id number]

## 2019-06-13 ENCOUNTER — Ambulatory Visit: Payer: Self-pay | Attending: Internal Medicine

## 2019-06-13 DIAGNOSIS — Z23 Encounter for immunization: Secondary | ICD-10-CM

## 2019-06-13 NOTE — Progress Notes (Signed)
   Covid-19 Vaccination Clinic  Name:  Alicia Steele    MRN: [id number] DOB: 11-10-1965  06/13/2019  [name] was observed post Covid-19 immunization for 15 minutes without incident. She was provided with Vaccine Information Sheet and instruction to access the V-Safe system.   [name] was instructed to call 911 with any severe reactions post vaccine: Marland Kitchen Difficulty breathing  . Swelling of face and throat  . A fast heartbeat  . A bad rash all over body  . Dizziness and weakness   Immunizations Administered    Name Date Dose VIS Date Route   Pfizer COVID-19 Vaccine 06/13/2019 12:19 PM 0.3 mL 02/18/2019 Intramuscular   Manufacturer: [place]   Lot: [id number]   [place]: [id number]

## 2019-08-03 ENCOUNTER — Encounter: Payer: Self-pay | Admitting: Family Medicine

## 2019-08-03 ENCOUNTER — Ambulatory Visit (INDEPENDENT_AMBULATORY_CARE_PROVIDER_SITE_OTHER): Payer: Self-pay | Admitting: Family Medicine

## 2019-08-03 ENCOUNTER — Other Ambulatory Visit: Payer: Self-pay

## 2019-08-03 VITALS — BP 129/77 | HR 86 | Ht 66.0 in | Wt 218.0 lb

## 2019-08-03 DIAGNOSIS — F439 Reaction to severe stress, unspecified: Secondary | ICD-10-CM

## 2019-08-03 DIAGNOSIS — I10 Essential (primary) hypertension: Secondary | ICD-10-CM

## 2019-08-03 DIAGNOSIS — F0630 Mood disorder due to known physiological condition, unspecified: Secondary | ICD-10-CM

## 2019-08-03 DIAGNOSIS — E282 Polycystic ovarian syndrome: Secondary | ICD-10-CM

## 2019-08-03 LAB — POCT GLYCOSYLATED HEMOGLOBIN (HGB A1C): Hemoglobin A1C: 5.6 % (ref 4.0–5.6)

## 2019-08-03 MED ORDER — AMLODIPINE BESYLATE 2.5 MG PO TABS: 2.5000 mg | ORAL_TABLET | Freq: Every day | ORAL | 1 refills | Status: DC

## 2019-08-03 MED ORDER — FUROSEMIDE 40 MG PO TABS: ORAL_TABLET | ORAL | 0 refills | Status: DC

## 2019-08-03 MED ORDER — CITALOPRAM HYDROBROMIDE 40 MG PO TABS: 40.0000 mg | ORAL_TABLET | Freq: Every day | ORAL | 0 refills | Status: DC

## 2019-08-03 MED ORDER — LOSARTAN POTASSIUM-HCTZ 100-25 MG PO TABS: 1.0000 | ORAL_TABLET | Freq: Every day | ORAL | 1 refills | Status: DC

## 2019-08-03 MED ORDER — METFORMIN HCL ER 500 MG PO TB24: 1000.0000 mg | ORAL_TABLET | Freq: Every day | ORAL | 1 refills | Status: DC

## 2019-08-03 MED ORDER — CLONAZEPAM 0.5 MG PO TABS: ORAL_TABLET | ORAL | 0 refills | Status: DC

## 2019-08-03 NOTE — Assessment & Plan Note (Signed)
To recheck hemoglobin A1c today.  It looks fantastic.  Continue with metformin.

## 2019-08-03 NOTE — Assessment & Plan Note (Signed)
Definitely go back up to 40 mg on the citalopram.  Will refill prescription today.  Also given a short supply of clonazepam to use sparingly we did warn about potential for dependency and again to use sparingly also recommended may be using something like the calm app or talk space or we could even consider more formal referral for therapy/counseling if she would like.

## 2019-08-03 NOTE — Assessment & Plan Note (Signed)
Pressure well controlled continue current regimen.  Due for updated lab work.  She tried to go this morning but she had had a few strawberries so they told her she need to be completely fasting she will try to go again tomorrow.

## 2019-08-03 NOTE — Progress Notes (Signed)
Established Patient Office Visit  Subjective:  Patient ID: Alicia Steele, female    DOB: 01-06-1966  Age: 54 y.o. MRN: [id number]  CC:  Chief Complaint  Patient presents with  . Hypertension  . mood    HPI Alicia Steele presents for   Hypertension- Pt denies chest pain, SOB, dizziness, or heart palpitations.  Taking meds as directed w/o problems.  Denies medication side effects.    PCOS - due for screening A1C.     Anxiety  - PHQ-9 score of 15 today. On citalopram 40mg .  GAD - 7 score of 12.  Unfortunately she recently broke up with her partner who she has been with for almost the last year.  She is questioning what she may have done that caused problems in the relationship.  She just feeling very overwhelmed and anxious.  She says she is not sleeping well is very tearful.  Has not been able to focus at work and says that she tries not to show it in front of her son but it is been really challenging.  She did have a prescription for clonazepam about 6 or 7 years ago which she used for short period of time which was helpful and would like to consider prescription for this again.  She had recently cut back on her citalopram to 20 mg but says she just increased it back to 40 mg.  The break-up occurred about a week ago.  Past Medical History:  Diagnosis Date  . HYPERTENSION, BENIGN 02/11/2010   Qualifier: Diagnosis of  By: Madilyn Fireman MD, Barnetta Chapel    . Mood disorder in conditions classified elsewhere 05/20/2012   On celexa for tearfulness. Works well.      Past Surgical History:  Procedure Laterality Date  . TONSILLECTOMY    . uterine ablation  02/2009    Family History  Problem Relation Age of Onset  . Heart disease Mother        MI  . Hyperlipidemia Mother   . Hypertension Mother   . Depression Mother     Social History   Socioeconomic History  . Marital status: Single    Spouse name: Not on file  . Number of children: Not on file  . Years of education: Not on file   . Highest education level: Not on file  Occupational History  . Not on file  Tobacco Use  . Smoking status: Never Smoker  . Smokeless tobacco: Never Used  Substance and Sexual Activity  . Alcohol use: Yes  . Drug use: No  . Sexual activity: Not on file  Other Topics Concern  . Not on file  Social History Narrative  . Not on file   Social Determinants of Health   Financial Resource Strain:   . Difficulty of Paying Living Expenses:   Food Insecurity:   . Worried About Charity fundraiser in the Last Year:   . Arboriculturist in the Last Year:   Transportation Needs:   . Film/video editor (Medical):   Marland Kitchen Lack of Transportation (Non-Medical):   Physical Activity:   . Days of Exercise per Week:   . Minutes of Exercise per Session:   Stress:   . Feeling of Stress :   Social Connections:   . Frequency of Communication with Friends and Family:   . Frequency of Social Gatherings with Friends and Family:   . Attends Religious Services:   . Active Member of Clubs or Organizations:   .  Attends Archivist Meetings:   Marland Kitchen Marital Status:   Intimate Partner Violence:   . Fear of Current or Ex-Partner:   . Emotionally Abused:   Marland Kitchen Physically Abused:   . Sexually Abused:     Outpatient Medications Prior to Visit  Medication Sig Dispense Refill  . amLODipine (NORVASC) 2.5 MG tablet TAKE ONE TABLET BY MOUTH DAILY 90 tablet 0  . citalopram (CELEXA) 40 MG tablet TAKE ONE TABLET BY MOUTH DAILY 90 tablet 0  . furosemide (LASIX) 40 MG tablet TAKE ONE TABLET BY MOUTH DAILY AS NEEDED FOR EDEMA 90 tablet 0  . losartan-hydrochlorothiazide (HYZAAR) 100-25 MG tablet TAKE ONE TABLET BY MOUTH DAILY 90 tablet 0  . metFORMIN (GLUCOPHAGE-XR) 500 MG 24 hr tablet TAKE TWO TABLETS BY MOUTH EVERY MORNING WITH BREAKFAST 180 tablet 0   No facility-administered medications prior to visit.    No Known Allergies  ROS Review of Systems    Objective:    Physical Exam  Constitutional:  She is oriented to person, place, and time. She appears well-developed and well-nourished.  HENT:  Head: Normocephalic and atraumatic.  Eyes: Conjunctivae and EOM are normal.  Cardiovascular: Normal rate.  Pulmonary/Chest: Effort normal.  Neurological: She is alert and oriented to person, place, and time.  Skin: Skin is warm and dry. No pallor.  Psychiatric: She has a normal mood and affect. Her behavior is normal.  Vitals reviewed.   BP 129/77   Pulse 86   Ht 5\' 6"  (1.676 m)   Wt 218 lb (98.9 kg)   SpO2 95%   BMI 35.19 kg/m  Wt Readings from Last 3 Encounters:  08/03/19 218 lb (98.9 kg)  06/03/18 200 lb (90.7 kg)  10/28/17 204 lb (92.5 kg)     Health Maintenance Due  Topic Date Due  . MAMMOGRAM  05/05/2016  . PAP SMEAR-Modifier  05/28/2017    There are no preventive care reminders to display for this patient.  Lab Results  Component Value Date   TSH 6.91 (H) 04/13/2018   Lab Results  Component Value Date   WBC 6.1 04/13/2018   HGB 13.3 04/13/2018   HCT 39.4 04/13/2018   MCV 91.4 04/13/2018   PLT 462 (H) 04/13/2018   Lab Results  Component Value Date   NA 138 04/13/2018   K 5.2 04/13/2018   CO2 29 04/13/2018   GLUCOSE 98 04/13/2018   BUN 11 04/13/2018   CREATININE 0.66 04/13/2018   BILITOT 0.2 04/13/2018   ALKPHOS 67 10/22/2015   AST 20 04/13/2018   ALT 20 04/13/2018   PROT 7.2 04/13/2018   ALBUMIN 4.3 10/22/2015   CALCIUM 10.1 04/13/2018   Lab Results  Component Value Date   CHOL 296 (H) 04/13/2018   Lab Results  Component Value Date   HDL 81 04/13/2018   Lab Results  Component Value Date   LDLCALC 174 (H) 04/13/2018   Lab Results  Component Value Date   TRIG 244 (H) 04/13/2018   Lab Results  Component Value Date   CHOLHDL 3.7 04/13/2018   Lab Results  Component Value Date   HGBA1C 5.6 08/03/2019      Assessment & Plan:   Problem List Items Addressed This Visit      Cardiovascular and Mediastinum   HYPERTENSION, BENIGN     Pressure well controlled continue current regimen.  Due for updated lab work.  She tried to go this morning but she had had a few strawberries so they told her she  need to be completely fasting she will try to go again tomorrow.      Relevant Medications   amLODipine (NORVASC) 2.5 MG tablet   losartan-hydrochlorothiazide (HYZAAR) 100-25 MG tablet   furosemide (LASIX) 40 MG tablet     Endocrine   POLYCYSTIC OVARIAN DISEASE - Primary    To recheck hemoglobin A1c today.  It looks fantastic.  Continue with metformin.      Relevant Medications   metFORMIN (GLUCOPHAGE-XR) 500 MG 24 hr tablet   Other Relevant Orders   POCT glycosylated hemoglobin (Hb A1C) (Completed)     Other   Mood disorder in conditions classified elsewhere    Definitely go back up to 40 mg on the citalopram.  Will refill prescription today.  Also given a short supply of clonazepam to use sparingly we did warn about potential for dependency and again to use sparingly also recommended may be using something like the calm app or talk space or we could even consider more formal referral for therapy/counseling if she would like.      Relevant Medications   citalopram (CELEXA) 40 MG tablet    Other Visit Diagnoses    Stress       Relevant Medications   clonazePAM (KLONOPIN) 0.5 MG tablet      Meds ordered this encounter  Medications  . amLODipine (NORVASC) 2.5 MG tablet    Sig: Take 1 tablet (2.5 mg total) by mouth daily.    Dispense:  90 tablet    Refill:  1  . citalopram (CELEXA) 40 MG tablet    Sig: Take 1 tablet (40 mg total) by mouth daily.    Dispense:  90 tablet    Refill:  0  . losartan-hydrochlorothiazide (HYZAAR) 100-25 MG tablet    Sig: Take 1 tablet by mouth daily.    Dispense:  90 tablet    Refill:  1  . metFORMIN (GLUCOPHAGE-XR) 500 MG 24 hr tablet    Sig: Take 2 tablets (1,000 mg total) by mouth daily with breakfast.    Dispense:  180 tablet    Refill:  1  . furosemide (LASIX) 40 MG tablet     Sig: TAKE ONE TABLET BY MOUTH DAILY AS NEEDED FOR EDEMA    Dispense:  90 tablet    Refill:  0  . clonazePAM (KLONOPIN) 0.5 MG tablet    Sig: Half to whole tab every evening only as needed for anxiety or insomnia.    Dispense:  20 tablet    Refill:  0    Follow-up: No follow-ups on file.    Beatrice Lecher, MD

## 2019-08-24 ENCOUNTER — Other Ambulatory Visit: Payer: Self-pay | Admitting: *Deleted

## 2019-08-24 DIAGNOSIS — F439 Reaction to severe stress, unspecified: Secondary | ICD-10-CM

## 2019-08-24 MED ORDER — CLONAZEPAM 0.5 MG PO TABS: ORAL_TABLET | ORAL | 0 refills | Status: DC

## 2019-10-28 ENCOUNTER — Telehealth: Payer: Self-pay | Admitting: *Deleted

## 2019-10-28 MED ORDER — AZITHROMYCIN 250 MG PO TABS: ORAL_TABLET | ORAL | 0 refills | Status: DC

## 2019-10-28 NOTE — Telephone Encounter (Signed)
Pt called and stated that she hasn't been feeling well and got tested at [facility] for COVID about 1.5 wks ago and her test was negative. She said that she wasn't feeling any better so she took another test but she did an at home test and that was also negative. She wanted to know if Dr. Madilyn Fireman would call something in for her she said that she has been taking mucinex,and some other sinus allergy medication to help with this. She has been blowing out green/yellow mucous. Denies any f/s/c/n/v/d facial pain she said that its now starting to move into her chest and she's starting to wheeze.

## 2019-10-28 NOTE — Telephone Encounter (Signed)
Called pt and advised her that she will need to use the E-visit next time something like this happens since she doesn't have insurance. She voiced understanding and agreed.

## 2019-11-28 ENCOUNTER — Other Ambulatory Visit: Payer: Self-pay | Admitting: Family Medicine

## 2019-11-28 DIAGNOSIS — F0630 Mood disorder due to known physiological condition, unspecified: Secondary | ICD-10-CM

## 2020-02-28 ENCOUNTER — Other Ambulatory Visit: Payer: Self-pay | Admitting: Family Medicine

## 2020-02-28 DIAGNOSIS — E282 Polycystic ovarian syndrome: Secondary | ICD-10-CM

## 2020-02-28 DIAGNOSIS — F0630 Mood disorder due to known physiological condition, unspecified: Secondary | ICD-10-CM

## 2020-02-28 DIAGNOSIS — I10 Essential (primary) hypertension: Secondary | ICD-10-CM

## 2020-02-28 NOTE — Telephone Encounter (Signed)
Pt called and scheduled medication f/u. She did state she is running low on some and asked if refills could be called in to [facility] in [place]. Her appt is for 03/13/2020 at 2:00. The medications are Metformin, citalopram, amlodipine, and losartan-hydrchlorthiazide.

## 2020-03-13 ENCOUNTER — Ambulatory Visit: Payer: Self-pay | Admitting: Family Medicine

## 2020-03-15 ENCOUNTER — Telehealth (INDEPENDENT_AMBULATORY_CARE_PROVIDER_SITE_OTHER): Payer: Self-pay | Admitting: Family Medicine

## 2020-03-15 ENCOUNTER — Encounter: Payer: Self-pay | Admitting: Family Medicine

## 2020-03-15 VITALS — BP 138/82 | HR 78 | Temp 98.3°F | Ht 66.0 in | Wt 207.0 lb

## 2020-03-15 DIAGNOSIS — F0630 Mood disorder due to known physiological condition, unspecified: Secondary | ICD-10-CM

## 2020-03-15 DIAGNOSIS — E282 Polycystic ovarian syndrome: Secondary | ICD-10-CM

## 2020-03-15 DIAGNOSIS — I10 Essential (primary) hypertension: Secondary | ICD-10-CM

## 2020-03-15 DIAGNOSIS — E785 Hyperlipidemia, unspecified: Secondary | ICD-10-CM

## 2020-03-15 MED ORDER — METFORMIN HCL ER 500 MG PO TB24
ORAL_TABLET | ORAL | 0 refills | Status: DC
Start: 2020-03-15 — End: 2020-07-10

## 2020-03-15 MED ORDER — AMLODIPINE BESYLATE 5 MG PO TABS: 5.0000 mg | ORAL_TABLET | Freq: Every day | ORAL | 1 refills | Status: DC

## 2020-03-15 NOTE — Assessment & Plan Note (Signed)
Due to check lipids.

## 2020-03-15 NOTE — Assessment & Plan Note (Signed)
Well controlled. Continue current regimen. Follow up in 3-6 months.

## 2020-03-15 NOTE — Assessment & Plan Note (Signed)
Not as well controlled as I would like.  Will increase amlodpine to 5mg  F/U in 3 months. Due for labs.

## 2020-03-15 NOTE — Progress Notes (Signed)
Virtual Visit via Video Note  I connected with Alicia Steele on 03/15/20 at  1:40 PM EST by a video enabled telemedicine application and verified that I am speaking with the correct person using two identifiers.   I discussed the limitations of evaluation and management by telemedicine and the availability of in person appointments. The patient expressed understanding and agreed to proceed.  Patient location: at home  Provider location: in office  Subjective:    CC: f/u BP  HPI: Hypertension- Pt denies chest pain, SOB, dizziness, or heart palpitations.  Taking meds as directed w/o problems.  Denies medication side effects.    Plans on staring rowing machine exercise, doesn't want to go back to the gym with COVID.  Has starting eating better.    Plans on getting her COVID Booster tomorrow.    Past medical history, Surgical history, Family history not pertinant except as noted below, Social history, Allergies, and medications have been entered into the medical record, reviewed, and corrections made.   Review of Systems: No fevers, chills, night sweats, weight loss, chest pain, or shortness of breath.   Objective:    General: Speaking clearly in complete sentences without any shortness of breath.  Alert and oriented x3.  Normal judgment. No apparent acute distress.    Impression and Recommendations:    POLYCYSTIC OVARIAN DISEASE Doing well on metformin. Due for A1C  HYPERTENSION, BENIGN Not as well controlled as I would like.  Will increase amlodpine to 5mg  F/U in 3 months. Due for labs.    Mood disorder in conditions classified elsewhere Well controlled. Continue current regimen. Follow up in 3-6 months.    Hyperlipemia Due to check lipids.     Time spent in encounter 20 minutes  I discussed the assessment and treatment plan with the patient. The patient was provided an opportunity to ask questions and all were answered. The patient agreed with the plan and  demonstrated an understanding of the instructions.   The patient was advised to call back or seek an in-person evaluation if the symptoms worsen or if the condition fails to improve as anticipated.   , MD

## 2020-03-15 NOTE — Assessment & Plan Note (Signed)
Doing well on metformin. Due for A1C

## 2020-06-05 ENCOUNTER — Other Ambulatory Visit: Payer: Self-pay | Admitting: Family Medicine

## 2020-06-05 DIAGNOSIS — F0630 Mood disorder due to known physiological condition, unspecified: Secondary | ICD-10-CM

## 2020-06-08 ENCOUNTER — Other Ambulatory Visit: Payer: Self-pay | Admitting: Family Medicine

## 2020-06-08 DIAGNOSIS — I10 Essential (primary) hypertension: Secondary | ICD-10-CM

## 2020-06-08 NOTE — Telephone Encounter (Signed)
Please call pt and have her schedule f/u for bp, FASTING LABS(She didn't get these done in January when they were ordered) and medication refills

## 2020-06-08 NOTE — Telephone Encounter (Signed)
LVM for patient to call back to get this appt scheduled. AM

## 2020-06-28 LAB — HEMOGLOBIN A1C
Hgb A1c MFr Bld: 5.4 % of total Hgb (ref <5.7)
Mean Plasma Glucose: 108 mg/dL
eAG (mmol/L): 6 mmol/L

## 2020-06-28 LAB — COMPLETE METABOLIC PANEL WITH GFR
AG Ratio: 1.6 (calc) (ref 1.0–2.5)
ALT: 14 U/L (ref 6–29)
AST: 18 U/L (ref 10–35)
Albumin: 4.5 g/dL (ref 3.6–5.1)
Alkaline phosphatase (APISO): 71 U/L (ref 37–153)
BUN: 7 mg/dL (ref 7–25)
CO2: 31 mmol/L (ref 20–32)
Calcium: 9.8 mg/dL (ref 8.6–10.4)
Chloride: 96 mmol/L — ABNORMAL LOW (ref 98–110)
Creat: 0.64 mg/dL (ref 0.50–1.05)
GFR, Est African American: 117 mL/min/{1.73_m2} (ref >=60)
GFR, Est Non African American: 101 mL/min/{1.73_m2} (ref >=60)
Globulin: 2.9 g/dL (calc) (ref 1.9–3.7)
Glucose, Bld: 105 mg/dL — ABNORMAL HIGH (ref 65–99)
Potassium: 4 mmol/L (ref 3.5–5.3)
Sodium: 137 mmol/L (ref 135–146)
Total Bilirubin: 0.4 mg/dL (ref 0.2–1.2)
Total Protein: 7.4 g/dL (ref 6.1–8.1)

## 2020-06-28 LAB — TSH: TSH: 2.79 mIU/L

## 2020-06-28 LAB — LIPID PANEL W/REFLEX DIRECT LDL
Cholesterol: 262 mg/dL — ABNORMAL HIGH (ref <200)
HDL: 82 mg/dL (ref >=50)
LDL Cholesterol (Calc): 150 mg/dL (calc) — ABNORMAL HIGH
Non-HDL Cholesterol (Calc): 180 mg/dL (calc) — ABNORMAL HIGH (ref <130)
Total CHOL/HDL Ratio: 3.2 (calc) (ref <5.0)
Triglycerides: 162 mg/dL — ABNORMAL HIGH (ref <150)

## 2020-07-10 ENCOUNTER — Encounter: Payer: Self-pay | Admitting: Family Medicine

## 2020-07-10 ENCOUNTER — Other Ambulatory Visit: Payer: Self-pay | Admitting: Family Medicine

## 2020-07-10 ENCOUNTER — Telehealth (INDEPENDENT_AMBULATORY_CARE_PROVIDER_SITE_OTHER): Payer: Self-pay | Admitting: Family Medicine

## 2020-07-10 VITALS — BP 113/81

## 2020-07-10 DIAGNOSIS — H9191 Unspecified hearing loss, right ear: Secondary | ICD-10-CM

## 2020-07-10 DIAGNOSIS — I10 Essential (primary) hypertension: Secondary | ICD-10-CM

## 2020-07-10 DIAGNOSIS — R928 Other abnormal and inconclusive findings on diagnostic imaging of breast: Secondary | ICD-10-CM

## 2020-07-10 DIAGNOSIS — F0630 Mood disorder due to known physiological condition, unspecified: Secondary | ICD-10-CM

## 2020-07-10 DIAGNOSIS — H9193 Unspecified hearing loss, bilateral: Secondary | ICD-10-CM | POA: Insufficient documentation

## 2020-07-10 DIAGNOSIS — E282 Polycystic ovarian syndrome: Secondary | ICD-10-CM

## 2020-07-10 MED ORDER — METFORMIN HCL ER 500 MG PO TB24: ORAL_TABLET | ORAL | 1 refills | Status: DC

## 2020-07-10 MED ORDER — CITALOPRAM HYDROBROMIDE 40 MG PO TABS
40.0000 mg | ORAL_TABLET | Freq: Every day | ORAL | 1 refills | Status: DC
Start: 2020-07-10 — End: 2021-02-27

## 2020-07-10 MED ORDER — AMLODIPINE BESYLATE 5 MG PO TABS
5.0000 mg | ORAL_TABLET | Freq: Every day | ORAL | 1 refills | Status: DC
Start: 2020-07-10 — End: 2021-02-27

## 2020-07-10 NOTE — Progress Notes (Signed)
Virtual Visit via Video Note  I connected with Alicia Steele on 07/10/20 at  2:20 PM EDT by a video enabled telemedicine application and verified that I am speaking with the correct person using two identifiers.   I discussed the limitations of evaluation and management by telemedicine and the availability of in person appointments. The patient expressed understanding and agreed to proceed.  Patient location: at home   Provider location: in office  Subjective:    CC: due for BP f/u.    HPI: Pt reports that she was told that she was supposed to get a f/u for her mammogram due to calcifications of R breast   Pt had her hearing checked at [facility] and was told that she would need to be seen by ENT for Bilateral ear hearing loss. She would like a referral for this. Had tonsils out at 26.   She will schedule f/u to have pap done w/Dr. Madilyn Fireman  Hypertension- Pt denies chest pain, SOB, dizziness, or heart palpitations.  Taking meds as directed w/o problems.  Denies medication side effects.  Just had labs completed on April 20.  No worrisome findings.   Past medical history, Surgical history, Family history not pertinant except as noted below, Social history, Allergies, and medications have been entered into the medical record, reviewed, and corrections made.   Review of Systems: No fevers, chills, night sweats, weight loss, chest pain, or shortness of breath.   Objective:    General: Speaking clearly in complete sentences without any shortness of breath.  Alert and oriented x3.  Normal judgment. No apparent acute distress.    Impression and Recommendations:    HYPERTENSION, BENIGN BP has been better upped the amlodipine.  Feels well on the med.  Refill sent to pharmacy.  Follow-up in 6 months.  Uses her Lasix as needed and does not really need refills today  Bilateral hearing loss Will refer to  ENT for more formal evaluation to make sure were not missing anything.  Patient prefers  to stay more local..    Mood disorder in conditions classified elsewhere Stable on current regimen happy with her current dose she feels like it is effective and working well.  Plan to follow-up in 6 months.  POLYCYSTIC OVARIAN DISEASE Continue metformin.  Recommend screening A1c yearly.  Last mammo in 2013 was abnormal.  We will get her scheduled for diagnostic mammo so we can get her back on track for screening.    Time spent in encounter 22 minutes  I discussed the assessment and treatment plan with the patient. The patient was provided an opportunity to ask questions and all were answered. The patient agreed with the plan and demonstrated an understanding of the instructions.   The patient was advised to call back or seek an in-person evaluation if the symptoms worsen or if the condition fails to improve as anticipated.   Beatrice Lecher, MD

## 2020-07-10 NOTE — Assessment & Plan Note (Addendum)
BP has been better upped the amlodipine.  Feels well on the med.  Refill sent to pharmacy.  Follow-up in 6 months.  Uses her Lasix as needed and does not really need refills today

## 2020-07-10 NOTE — Progress Notes (Signed)
Pt reports that she was told that she was supposed to get a f/u for her mammogram due to calcifications of R breast   Pt had her hearing checked at [facility] and was told that she would need to be seen by ENT for R ear hearing loss. She would like a referral for this.  She will schedule f/u to have pap done w/Dr. Madilyn Fireman

## 2020-07-10 NOTE — Assessment & Plan Note (Signed)
Continue metformin.  Recommend screening A1c yearly.

## 2020-07-10 NOTE — Assessment & Plan Note (Addendum)
Will refer to  ENT for more formal evaluation to make sure were not missing anything.  Patient prefers to stay more local..

## 2020-07-10 NOTE — Assessment & Plan Note (Signed)
Stable on current regimen happy with her current dose she feels like it is effective and working well.  Plan to follow-up in 6 months.

## 2020-07-25 ENCOUNTER — Other Ambulatory Visit: Payer: Self-pay | Admitting: Family Medicine

## 2020-07-25 DIAGNOSIS — R928 Other abnormal and inconclusive findings on diagnostic imaging of breast: Secondary | ICD-10-CM

## 2020-07-25 DIAGNOSIS — Z1231 Encounter for screening mammogram for malignant neoplasm of breast: Secondary | ICD-10-CM

## 2020-08-30 ENCOUNTER — Ambulatory Visit
Admission: RE | Admit: 2020-08-30 | Discharge: 2020-08-30 | Disposition: A | Discharge status: Home / Self Care | Payer: 59 | Source: Ambulatory Visit | Attending: Family Medicine | Admitting: Family Medicine

## 2020-08-30 ENCOUNTER — Ambulatory Visit: Payer: Self-pay

## 2020-08-30 ENCOUNTER — Other Ambulatory Visit: Payer: Self-pay | Admitting: Family Medicine

## 2020-08-30 ENCOUNTER — Other Ambulatory Visit: Payer: Self-pay

## 2020-08-30 DIAGNOSIS — R928 Other abnormal and inconclusive findings on diagnostic imaging of breast: Secondary | ICD-10-CM

## 2020-08-30 DIAGNOSIS — Z1231 Encounter for screening mammogram for malignant neoplasm of breast: Secondary | ICD-10-CM

## 2020-09-03 ENCOUNTER — Other Ambulatory Visit: Payer: Self-pay | Admitting: Family Medicine

## 2020-09-03 DIAGNOSIS — R928 Other abnormal and inconclusive findings on diagnostic imaging of breast: Secondary | ICD-10-CM

## 2020-09-18 ENCOUNTER — Ambulatory Visit
Admission: RE | Admit: 2020-09-18 | Discharge: 2020-09-18 | Disposition: A | Discharge status: Home / Self Care | Payer: 59 | Source: Ambulatory Visit | Attending: Family Medicine | Admitting: Family Medicine

## 2020-09-18 ENCOUNTER — Other Ambulatory Visit: Payer: Self-pay

## 2020-09-18 ENCOUNTER — Other Ambulatory Visit: Payer: Self-pay | Admitting: Family Medicine

## 2020-09-18 DIAGNOSIS — R921 Mammographic calcification found on diagnostic imaging of breast: Secondary | ICD-10-CM

## 2020-09-18 DIAGNOSIS — R928 Other abnormal and inconclusive findings on diagnostic imaging of breast: Secondary | ICD-10-CM

## 2020-10-10 ENCOUNTER — Other Ambulatory Visit: Payer: Self-pay

## 2020-10-10 ENCOUNTER — Ambulatory Visit
Admission: RE | Admit: 2020-10-10 | Discharge: 2020-10-10 | Disposition: A | Discharge status: Home / Self Care | Payer: 59 | Source: Ambulatory Visit | Attending: Family Medicine | Admitting: Family Medicine

## 2020-10-10 DIAGNOSIS — R921 Mammographic calcification found on diagnostic imaging of breast: Secondary | ICD-10-CM

## 2020-10-11 ENCOUNTER — Other Ambulatory Visit: Payer: Self-pay | Admitting: Family Medicine

## 2020-10-11 DIAGNOSIS — N6099 Unspecified benign mammary dysplasia of unspecified breast: Secondary | ICD-10-CM

## 2020-10-24 ENCOUNTER — Other Ambulatory Visit: Payer: Self-pay

## 2020-10-24 ENCOUNTER — Ambulatory Visit
Admission: RE | Admit: 2020-10-24 | Discharge: 2020-10-24 | Disposition: A | Discharge status: Home / Self Care | Payer: 59 | Source: Ambulatory Visit | Attending: Family Medicine | Admitting: Family Medicine

## 2020-10-24 ENCOUNTER — Other Ambulatory Visit: Payer: Self-pay | Admitting: Family Medicine

## 2020-10-24 DIAGNOSIS — N6099 Unspecified benign mammary dysplasia of unspecified breast: Secondary | ICD-10-CM

## 2020-11-02 ENCOUNTER — Ambulatory Visit: Payer: Self-pay | Admitting: General Surgery

## 2020-11-02 DIAGNOSIS — N6091 Unspecified benign mammary dysplasia of right breast: Secondary | ICD-10-CM

## 2020-11-06 ENCOUNTER — Other Ambulatory Visit: Payer: Self-pay | Admitting: General Surgery

## 2020-11-06 DIAGNOSIS — N6091 Unspecified benign mammary dysplasia of right breast: Secondary | ICD-10-CM

## 2020-11-16 ENCOUNTER — Telehealth: Payer: Self-pay | Admitting: Hematology and Oncology

## 2020-11-16 NOTE — Telephone Encounter (Signed)
Scheduled appt per 9/7 referral. Pt is aware of appt date and time.

## 2020-11-26 NOTE — Progress Notes (Deleted)
[place] NOTE  Patient Care Team: Hali Marry, MD as PCP - General Eldridge Abrahams, MD (Gynecology)  CHIEF COMPLAINTS/PURPOSE OF CONSULTATION:  ***  ASSESSMENT & PLAN:  No problem-specific Assessment & Plan notes found for this encounter.  No orders of the defined types were placed in this encounter.  Pathology review: I discussed the difference between atypical ductal hyperplasia, DCIS and invasive breast cancer. I explained to her that atypical ductal hyperplasia  is characterized by a proliferation of uniform epithelial cells filling part, but not the entirety, of the involved duct. ADH is associated with an increased risk of both ipsilateral and contralateral breast cancer and thus provides evidence of underlying breast abnormalities that predispose to breast cancer.   Prognosis:Using the TC model calculated,   I discussed the risks and benefits of tamoxifen therapy. I believe that the risks far outweigh any benefits from tamoxifen. Instead I recommended the following 1. Exercise 30 minutes daily or 150 minutes a week. 2. Weight loss 3. Increasing fruits and vegetables and less red meat I believe all of the above would extensively decrease her risk of breast cancer as well as other cancers including cancers of the colon substantially.  Surveillance plan: Annual mammograms and breast exams    HISTORY OF PRESENTING ILLNESS:  Alicia Steele 55 y.o. female is here because of ADH right breast  This is a 55 year old female patient who recently underwent routine screening mammogram when she was found to have 4 small clusters of abnormal calcifications in the upper outer quadrant of the right breast.  3 were benign but the fourth 1 had atypical ductal hyperplasia.  She was seen by the surgeon recently, Dr. Marlou Starks and the plan was to consider right breast radioactive seed localized lumpectomy.  This is currently scheduled for September 29.  She is  simultaneously referred to high risk breast clinic for further recommendations.   REVIEW OF SYSTEMS:   Constitutional: Denies fevers, chills or abnormal night sweats Eyes: Denies blurriness of vision, double vision or watery eyes Ears, nose, mouth, throat, and face: Denies mucositis or sore throat Respiratory: Denies cough, dyspnea or wheezes Cardiovascular: Denies palpitation, chest discomfort or lower extremity swelling Gastrointestinal:  Denies nausea, heartburn or change in bowel habits Skin: Denies abnormal skin rashes Lymphatics: Denies new lymphadenopathy or easy bruising Neurological:Denies numbness, tingling or new weaknesses Behavioral/Psych: Mood is stable, no new changes  All other systems were reviewed with the patient and are negative.  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 Works well.      SURGICAL HISTORY: Past Surgical History:  Procedure Laterality Date   TONSILLECTOMY     uterine ablation  02/2009    SOCIAL HISTORY: Social History   Socioeconomic History   Marital status: Single    Spouse name: Not on file   Number of children: Not on file   Years of education: Not on file   Highest education level: Not on file  Occupational History   Not on file  Tobacco Use   Smoking status: Never   Smokeless tobacco: Never  Substance and Sexual Activity   Alcohol use: Yes   Drug use: No   Sexual activity: Not on file  Other Topics Concern   Not on file  Social History Narrative  Not on file   Social Determinants of Health   Financial Resource Strain: Not on file  Food Insecurity: Not on file  Transportation Needs: Not on file  Physical Activity: Not on file  Stress: Not on file  Social Connections: Not on file  Intimate Partner Violence: Not on file     FAMILY HISTORY: Family History  Problem Relation Age of Onset   Heart disease Mother        MI   Hyperlipidemia Mother    Hypertension Mother    Depression Mother     ALLERGIES:  is allergic to latex.  MEDICATIONS:  Current Outpatient Medications  Medication Sig Dispense Refill   amLODipine (NORVASC) 5 MG tablet Take 1 tablet (5 mg total) by mouth daily. 90 tablet 1   citalopram (CELEXA) 40 MG tablet Take 1 tablet (40 mg total) by mouth daily. 90 tablet 1   furosemide (LASIX) 40 MG tablet TAKE ONE TABLET BY MOUTH DAILY AS NEEDED FOR EDEMA 90 tablet 0   losartan-hydrochlorothiazide (HYZAAR) 100-25 MG tablet TAKE ONE TABLET BY MOUTH DAILY 90 tablet 1   metFORMIN (GLUCOPHAGE-XR) 500 MG 24 hr tablet TAKE TWO TABLETS BY MOUTH DAILY WITH BREAKFAST 180 tablet 1   No current facility-administered medications for this visit.     PHYSICAL EXAMINATION: ECOG PERFORMANCE STATUS: {CHL ONC ECOG PS:[id number]}  There were no vitals filed for this visit. There were no vitals filed for this visit.  GENERAL:alert, no distress and comfortable SKIN: skin color, texture, turgor are normal, no rashes or significant lesions EYES: normal, conjunctiva are pink and non-injected, sclera clear OROPHARYNX:no exudate, no erythema and lips, buccal mucosa, and tongue normal  NECK: supple, thyroid normal size, non-tender, without nodularity LYMPH:  no palpable lymphadenopathy in the cervical, axillary or inguinal LUNGS: clear to auscultation and percussion with normal breathing effort HEART: regular rate & rhythm and no murmurs and no lower extremity edema ABDOMEN:abdomen soft, non-tender and normal bowel sounds Musculoskeletal:no cyanosis of digits and no clubbing  PSYCH: alert & oriented x 3 with fluent speech NEURO: no focal motor/sensory deficits  LABORATORY DATA:  I have reviewed the data as listed Lab Results  Component Value Date   WBC 6.1 04/13/2018   HGB 13.3 04/13/2018   HCT 39.4  04/13/2018   MCV 91.4 04/13/2018   PLT 462 (H) 04/13/2018     Chemistry      Component Value Date/Time   NA 137 06/27/2020 0000   K 4.0 06/27/2020 0000   CL 96 (L) 06/27/2020 0000   CO2 31 06/27/2020 0000   BUN 7 06/27/2020 0000   CREATININE 0.64 06/27/2020 0000      Component Value Date/Time   CALCIUM 9.8 06/27/2020 0000   ALKPHOS 67 10/22/2015 1206   AST 18 06/27/2020 0000   ALT 14 06/27/2020 0000   BILITOT 0.4 06/27/2020 0000       RADIOGRAPHIC STUDIES: I have personally reviewed the radiological images as listed and agreed with the findings in the report. No results found.  All questions were answered. The patient knows to call the clinic with any problems, questions or concerns. I spent *** minutes in the care of this patient including H and P, review of records, counseling and coordination of care.     Benay Pike, MD 11/26/2020 12:30 PM

## 2020-11-27 ENCOUNTER — Inpatient Hospital Stay: Payer: 59 | Admitting: Hematology and Oncology

## 2020-11-30 ENCOUNTER — Encounter (HOSPITAL_BASED_OUTPATIENT_CLINIC_OR_DEPARTMENT_OTHER): Payer: Self-pay | Admitting: General Surgery

## 2020-12-05 ENCOUNTER — Other Ambulatory Visit: Payer: Self-pay

## 2020-12-05 ENCOUNTER — Ambulatory Visit
Admission: RE | Admit: 2020-12-05 | Discharge: 2020-12-05 | Disposition: A | Discharge status: Home / Self Care | Payer: 59 | Source: Ambulatory Visit | Attending: General Surgery | Admitting: General Surgery

## 2020-12-05 ENCOUNTER — Encounter (HOSPITAL_BASED_OUTPATIENT_CLINIC_OR_DEPARTMENT_OTHER)
Admission: RE | Admit: 2020-12-05 | Discharge: 2020-12-05 | Disposition: A | Discharge status: Home / Self Care | Payer: 59 | Source: Ambulatory Visit | Attending: General Surgery | Admitting: General Surgery

## 2020-12-05 DIAGNOSIS — N6091 Unspecified benign mammary dysplasia of right breast: Secondary | ICD-10-CM

## 2020-12-05 DIAGNOSIS — Z01812 Encounter for preprocedural laboratory examination: Secondary | ICD-10-CM | POA: Insufficient documentation

## 2020-12-05 DIAGNOSIS — D241 Benign neoplasm of right breast: Secondary | ICD-10-CM | POA: Diagnosis not present

## 2020-12-05 LAB — BASIC METABOLIC PANEL
Anion gap: 10 (ref 5–15)
BUN: 11 mg/dL (ref 6–20)
CO2: 29 mmol/L (ref 22–32)
Calcium: 9.6 mg/dL (ref 8.9–10.3)
Chloride: 96 mmol/L — ABNORMAL LOW (ref 98–111)
Creatinine, Ser: 0.91 mg/dL (ref 0.44–1.00)
GFR, Estimated: >60 mL/min (ref >60)
Glucose, Bld: 80 mg/dL (ref 70–99)
Potassium: 3.7 mmol/L (ref 3.5–5.1)
Sodium: 135 mmol/L (ref 135–145)

## 2020-12-05 NOTE — Progress Notes (Signed)

## 2020-12-06 ENCOUNTER — Encounter (HOSPITAL_BASED_OUTPATIENT_CLINIC_OR_DEPARTMENT_OTHER): Payer: Self-pay | Admitting: General Surgery

## 2020-12-06 ENCOUNTER — Other Ambulatory Visit: Payer: Self-pay

## 2020-12-06 ENCOUNTER — Ambulatory Visit (HOSPITAL_BASED_OUTPATIENT_CLINIC_OR_DEPARTMENT_OTHER): Payer: 59 | Admitting: Certified Registered"

## 2020-12-06 ENCOUNTER — Ambulatory Visit
Admission: RE | Admit: 2020-12-06 | Discharge: 2020-12-06 | Disposition: A | Discharge status: Home / Self Care | Payer: 59 | Source: Ambulatory Visit | Attending: General Surgery | Admitting: General Surgery

## 2020-12-06 ENCOUNTER — Ambulatory Visit (HOSPITAL_BASED_OUTPATIENT_CLINIC_OR_DEPARTMENT_OTHER)
Admission: RE | Admit: 2020-12-06 | Discharge: 2020-12-06 | Disposition: A | Discharge status: Home / Self Care | Payer: 59 | Attending: General Surgery | Admitting: General Surgery

## 2020-12-06 ENCOUNTER — Encounter (HOSPITAL_BASED_OUTPATIENT_CLINIC_OR_DEPARTMENT_OTHER)
Admission: RE | Disposition: A | Discharge status: Home / Self Care | Payer: Self-pay | Source: Home / Self Care | Attending: General Surgery

## 2020-12-06 DIAGNOSIS — D241 Benign neoplasm of right breast: Secondary | ICD-10-CM | POA: Diagnosis not present

## 2020-12-06 DIAGNOSIS — N6091 Unspecified benign mammary dysplasia of right breast: Secondary | ICD-10-CM

## 2020-12-06 HISTORY — DX: Polycystic ovarian syndrome: E28.2

## 2020-12-06 HISTORY — DX: Other specified postprocedural states: Z98.890

## 2020-12-06 HISTORY — DX: Nausea with vomiting, unspecified: R11.2

## 2020-12-06 HISTORY — PX: BREAST LUMPECTOMY WITH RADIOACTIVE SEED LOCALIZATION: SHX6424

## 2020-12-06 SURGERY — BREAST LUMPECTOMY WITH RADIOACTIVE SEED LOCALIZATION
Anesthesia: General | Site: Breast | Laterality: Right

## 2020-12-06 MED ORDER — PROPOFOL 10 MG/ML IV BOLUS
INTRAVENOUS | Status: AC
  Filled 2020-12-06: qty 20

## 2020-12-06 MED ORDER — LIDOCAINE 2% (20 MG/ML) 5 ML SYRINGE
INTRAMUSCULAR | Status: AC
  Filled 2020-12-06: qty 5

## 2020-12-06 MED ORDER — GABAPENTIN 300 MG PO CAPS
300.0000 mg | ORAL_CAPSULE | ORAL | Status: AC
  Administered 2020-12-06: 300 mg via ORAL

## 2020-12-06 MED ORDER — ONDANSETRON HCL 4 MG/2ML IJ SOLN
INTRAMUSCULAR | Status: DC | PRN
  Administered 2020-12-06: 4 mg via INTRAVENOUS

## 2020-12-06 MED ORDER — CEFAZOLIN SODIUM-DEXTROSE 2-4 GM/100ML-% IV SOLN
INTRAVENOUS | Status: AC
  Filled 2020-12-06: qty 100

## 2020-12-06 MED ORDER — LACTATED RINGERS IV SOLN: INTRAVENOUS | Status: DC

## 2020-12-06 MED ORDER — GABAPENTIN 300 MG PO CAPS
ORAL_CAPSULE | ORAL | Status: AC
  Filled 2020-12-06: qty 1

## 2020-12-06 MED ORDER — ONDANSETRON HCL 4 MG/2ML IJ SOLN
4.0000 mg | Freq: Four times a day (QID) | INTRAMUSCULAR | Status: DC | PRN
Start: 2020-12-06 — End: 2020-12-06

## 2020-12-06 MED ORDER — SCOPOLAMINE 1 MG/3DAYS TD PT72
MEDICATED_PATCH | TRANSDERMAL | Status: DC | PRN
  Administered 2020-12-06: 1 via TRANSDERMAL

## 2020-12-06 MED ORDER — FENTANYL CITRATE (PF) 100 MCG/2ML IJ SOLN
INTRAMUSCULAR | Status: AC
  Filled 2020-12-06: qty 2

## 2020-12-06 MED ORDER — DEXMEDETOMIDINE (PRECEDEX) IN NS 20 MCG/5ML (4 MCG/ML) IV SYRINGE
PREFILLED_SYRINGE | INTRAVENOUS | Status: DC | PRN
  Administered 2020-12-06 (×5): 4 ug via INTRAVENOUS

## 2020-12-06 MED ORDER — 0.9 % SODIUM CHLORIDE (POUR BTL) OPTIME
TOPICAL | Status: DC | PRN
  Administered 2020-12-06: 1000 mL

## 2020-12-06 MED ORDER — MIDAZOLAM HCL 2 MG/2ML IJ SOLN
INTRAMUSCULAR | Status: AC
  Filled 2020-12-06: qty 2

## 2020-12-06 MED ORDER — HYDROCODONE-ACETAMINOPHEN 5-325 MG PO TABS: 1.0000 | ORAL_TABLET | Freq: Four times a day (QID) | ORAL | 0 refills | Status: DC | PRN

## 2020-12-06 MED ORDER — LIDOCAINE 2% (20 MG/ML) 5 ML SYRINGE
INTRAMUSCULAR | Status: DC | PRN
  Administered 2020-12-06: 60 mg via INTRAVENOUS

## 2020-12-06 MED ORDER — DEXMEDETOMIDINE (PRECEDEX) IN NS 20 MCG/5ML (4 MCG/ML) IV SYRINGE
PREFILLED_SYRINGE | INTRAVENOUS | Status: AC
  Filled 2020-12-06: qty 5

## 2020-12-06 MED ORDER — FENTANYL CITRATE (PF) 100 MCG/2ML IJ SOLN
INTRAMUSCULAR | Status: DC | PRN
  Administered 2020-12-06 (×2): 50 ug via INTRAVENOUS

## 2020-12-06 MED ORDER — FENTANYL CITRATE (PF) 100 MCG/2ML IJ SOLN: 25.0000 ug | INTRAMUSCULAR | Status: DC | PRN

## 2020-12-06 MED ORDER — CHLORHEXIDINE GLUCONATE CLOTH 2 % EX PADS: 6.0000 | MEDICATED_PAD | Freq: Once | CUTANEOUS | Status: DC

## 2020-12-06 MED ORDER — CEFAZOLIN SODIUM-DEXTROSE 2-4 GM/100ML-% IV SOLN
2.0000 g | INTRAVENOUS | Status: AC
  Administered 2020-12-06: 2 g via INTRAVENOUS

## 2020-12-06 MED ORDER — DEXAMETHASONE SODIUM PHOSPHATE 4 MG/ML IJ SOLN
INTRAMUSCULAR | Status: DC | PRN
Start: 2020-12-06 — End: 2020-12-06
  Administered 2020-12-06: 6 mg via INTRAVENOUS

## 2020-12-06 MED ORDER — PROPOFOL 10 MG/ML IV BOLUS
INTRAVENOUS | Status: DC | PRN
  Administered 2020-12-06: 200 mg via INTRAVENOUS
  Administered 2020-12-06: 20 mg via INTRAVENOUS
  Administered 2020-12-06 (×2): 100 mg via INTRAVENOUS
  Administered 2020-12-06: 150 mg via INTRAVENOUS
  Administered 2020-12-06: 200 mg via INTRAVENOUS

## 2020-12-06 MED ORDER — MIDAZOLAM HCL 5 MG/5ML IJ SOLN
INTRAMUSCULAR | Status: DC | PRN
  Administered 2020-12-06: 2 mg via INTRAVENOUS

## 2020-12-06 MED ORDER — CELECOXIB 200 MG PO CAPS
200.0000 mg | ORAL_CAPSULE | ORAL | Status: AC
  Administered 2020-12-06: 200 mg via ORAL

## 2020-12-06 MED ORDER — ACETAMINOPHEN 500 MG PO TABS
ORAL_TABLET | ORAL | Status: AC
  Filled 2020-12-06: qty 2

## 2020-12-06 MED ORDER — OXYCODONE HCL 5 MG/5ML PO SOLN: 5.0000 mg | Freq: Once | ORAL | Status: DC | PRN

## 2020-12-06 MED ORDER — PROPOFOL 500 MG/50ML IV EMUL
INTRAVENOUS | Status: DC | PRN
Start: 2020-12-06 — End: 2020-12-06
  Administered 2020-12-06: 150 ug/kg/min via INTRAVENOUS

## 2020-12-06 MED ORDER — ONDANSETRON HCL 4 MG/2ML IJ SOLN
INTRAMUSCULAR | Status: AC
  Filled 2020-12-06: qty 2

## 2020-12-06 MED ORDER — SCOPOLAMINE 1 MG/3DAYS TD PT72
MEDICATED_PATCH | TRANSDERMAL | Status: AC
  Filled 2020-12-06: qty 1

## 2020-12-06 MED ORDER — OXYCODONE HCL 5 MG PO TABS
5.0000 mg | ORAL_TABLET | Freq: Once | ORAL | Status: DC | PRN
Start: 2020-12-06 — End: 2020-12-06

## 2020-12-06 MED ORDER — CELECOXIB 200 MG PO CAPS
ORAL_CAPSULE | ORAL | Status: AC
  Filled 2020-12-06: qty 1

## 2020-12-06 MED ORDER — BUPIVACAINE-EPINEPHRINE (PF) 0.25% -1:200000 IJ SOLN
INTRAMUSCULAR | Status: DC | PRN
  Administered 2020-12-06: 20 mL

## 2020-12-06 MED ORDER — ACETAMINOPHEN 500 MG PO TABS
1000.0000 mg | ORAL_TABLET | ORAL | Status: AC
  Administered 2020-12-06: 1000 mg via ORAL

## 2020-12-06 SURGICAL SUPPLY — 41 items
ADH SKN CLS APL DERMABOND .7 (GAUZE/BANDAGES/DRESSINGS) ×1
APL PRP STRL LF DISP 70% ISPRP (MISCELLANEOUS) ×1
APPLIER CLIP 9.375 MED OPEN (MISCELLANEOUS)
APR CLP MED 9.3 20 MLT OPN (MISCELLANEOUS)
BLADE SURG 15 STRL LF DISP TIS (BLADE) ×1 IMPLANT
BLADE SURG 15 STRL SS (BLADE) ×2
CANISTER SUC SOCK COL 7IN (MISCELLANEOUS) IMPLANT
CANISTER SUCT 1200ML W/VALVE (MISCELLANEOUS) ×2 IMPLANT
CHLORAPREP W/TINT 26 (MISCELLANEOUS) ×2 IMPLANT
CLIP APPLIE 9.375 MED OPEN (MISCELLANEOUS) IMPLANT
COVER BACK TABLE 60X90IN (DRAPES) ×2 IMPLANT
COVER MAYO STAND STRL (DRAPES) ×2 IMPLANT
COVER PROBE W GEL 5X96 (DRAPES) ×2 IMPLANT
DECANTER SPIKE VIAL GLASS SM (MISCELLANEOUS) IMPLANT
DERMABOND ADVANCED (GAUZE/BANDAGES/DRESSINGS) ×1
DERMABOND ADVANCED .7 DNX12 (GAUZE/BANDAGES/DRESSINGS) ×1 IMPLANT
DRAPE LAPAROSCOPIC ABDOMINAL (DRAPES) ×2 IMPLANT
DRAPE UTILITY XL STRL (DRAPES) ×2 IMPLANT
ELECT COATED BLADE 2.86 ST (ELECTRODE) ×2 IMPLANT
ELECT REM PT RETURN 9FT ADLT (ELECTROSURGICAL) ×2
ELECTRODE REM PT RTRN 9FT ADLT (ELECTROSURGICAL) ×1 IMPLANT
GLOVE SURG ENC MOIS LTX SZ7.5 (GLOVE) ×4 IMPLANT
GOWN STRL REUS W/ TWL LRG LVL3 (GOWN DISPOSABLE) ×2 IMPLANT
GOWN STRL REUS W/TWL LRG LVL3 (GOWN DISPOSABLE) ×4
ILLUMINATOR WAVEGUIDE N/F (MISCELLANEOUS) IMPLANT
KIT MARKER MARGIN INK (KITS) ×2 IMPLANT
LIGHT WAVEGUIDE WIDE FLAT (MISCELLANEOUS) IMPLANT
NEEDLE HYPO 25X1 1.5 SAFETY (NEEDLE) ×2 IMPLANT
NS IRRIG 1000ML POUR BTL (IV SOLUTION) IMPLANT
PACK BASIN DAY SURGERY FS (CUSTOM PROCEDURE TRAY) ×2 IMPLANT
PENCIL SMOKE EVACUATOR (MISCELLANEOUS) ×2 IMPLANT
SLEEVE SCD COMPRESS KNEE MED (STOCKING) ×2 IMPLANT
SPONGE T-LAP 18X18 ~~LOC~~+RFID (SPONGE) ×2 IMPLANT
SUT MON AB 4-0 PC3 18 (SUTURE) ×2 IMPLANT
SUT SILK 2 0 SH (SUTURE) IMPLANT
SUT VICRYL 3-0 CR8 SH (SUTURE) ×2 IMPLANT
SYR CONTROL 10ML LL (SYRINGE) ×2 IMPLANT
TOWEL GREEN STERILE FF (TOWEL DISPOSABLE) ×2 IMPLANT
TRAY FAXITRON CT DISP (TRAY / TRAY PROCEDURE) ×2 IMPLANT
TUBE CONNECTING 20X1/4 (TUBING) ×2 IMPLANT
YANKAUER SUCT BULB TIP NO VENT (SUCTIONS) ×2 IMPLANT

## 2020-12-06 NOTE — Transfer of Care (Signed)
Immediate Anesthesia Transfer of Care Note  Patient: Alicia Steele  Procedure(s) Performed: RIGHT BREAST LUMPECTOMY WITH RADIOACTIVE SEED LOCALIZATION (Right: Breast)  Patient Location: PACU  Anesthesia Type:General  Level of Consciousness: drowsy  Airway & Oxygen Therapy: Patient Spontanous Breathing and Patient connected to face mask oxygen  Post-op Assessment: Report given to RN and Post -op Vital signs reviewed and stable  Post vital signs: Reviewed and stable  Last Vitals:  Vitals Value Taken Time  BP 109/70 12/06/20 1036  Temp    Pulse 82 12/06/20 1039  Resp 21 12/06/20 1039  SpO2 96 % 12/06/20 1039  Vitals shown include unvalidated device data.  Last Pain:  Vitals:   12/06/20 0905  TempSrc: Oral  PainSc: 0-No pain         Complications: No notable events documented.

## 2020-12-06 NOTE — Anesthesia Postprocedure Evaluation (Signed)
Anesthesia Post Note  Patient: Alicia Steele  Procedure(s) Performed: RIGHT BREAST LUMPECTOMY WITH RADIOACTIVE SEED LOCALIZATION (Right: Breast)     Patient location during evaluation: PACU Anesthesia Type: General Level of consciousness: awake and alert Pain management: pain level controlled Vital Signs Assessment: post-procedure vital signs reviewed and stable Respiratory status: spontaneous breathing, nonlabored ventilation, respiratory function stable and patient connected to nasal cannula oxygen Cardiovascular status: blood pressure returned to baseline and stable Postop Assessment: no apparent nausea or vomiting Anesthetic complications: no   No notable events documented.  Last Vitals:  Vitals:   12/06/20 1100 12/06/20 1122  BP: 115/72 119/72  Pulse: 68 66  Resp: 12 16  Temp:  36.4 C  SpO2: 90% 94%    Last Pain:  Vitals:   12/06/20 1122  TempSrc:   PainSc: 0-No pain                 Parag Dorton S

## 2020-12-06 NOTE — Op Note (Signed)
12/06/2020  10:28 AM  PATIENT:  Alicia Steele  55 y.o. female  PRE-OPERATIVE DIAGNOSIS:  RIGHT BREAST ADH  POST-OPERATIVE DIAGNOSIS:  RIGHT BREAST ADH  PROCEDURE:  Procedure(s): RIGHT BREAST LUMPECTOMY WITH RADIOACTIVE SEED LOCALIZATION (Right)  SURGEON:  Surgeon(s) and Role:    * Jovita Kussmaul, MD - Primary  PHYSICIAN ASSISTANT:   ASSISTANTS: none   ANESTHESIA:   local and general  EBL:  1 mL   BLOOD ADMINISTERED:none  DRAINS: none   LOCAL MEDICATIONS USED:  MARCAINE     SPECIMEN:  Source of Specimen:  right breast tissue  DISPOSITION OF SPECIMEN:  PATHOLOGY  COUNTS:  YES  TOURNIQUET:  * No tourniquets in log *  DICTATION: .Dragon Dictation  After informed consent was obtained the patient was brought to the operating room and placed in the supine position on the operating table.  After adequate induction of general anesthesia the patient's right breast was prepped with ChloraPrep, allowed to dry, and draped in usual sterile manner.  An appropriate timeout was performed.  Previously an I-125 seed was placed in the upper outer quadrant of the right breast to mark an area of atypical ductal hyperplasia.  The neoprobe was set to I-125 in the area of radioactivity was readily identified.  The area around this was infiltrated with quarter percent Marcaine.  Because of the superficial location of the seed I elected to make a small curvilinear incision in the upper outer quadrant overlying the area of radioactivity with a 15 blade knife.  The incision was carried through the skin and subcutaneous tissue sharply with the electrocautery.  Dissection was then carried around the radioactive seed while checking the area of radioactivity frequently.  Once the specimen was removed it was oriented with the appropriate paint colors.  A specimen radiograph was obtained that showed the clip and seed to be near the center of the specimen.  The specimen was then sent to pathology for further  evaluation.  Hemostasis was achieved using the Bovie electrocautery.  The wound was irrigated with saline and infiltrated with more quarter percent Marcaine.  The deep layer of the wound was then closed with layers of interrupted 3-0 Vicryl stitches.  The skin was then closed with a running 4-0 Monocryl subcuticular stitch.  Dermabond dressings were applied.  The patient tolerated the procedure well.  At the end of the case all needle sponge and instrument counts were correct.  The patient was then awakened and taken to recovery in stable condition.  PLAN OF CARE: Discharge to home after PACU  PATIENT DISPOSITION:  PACU - hemodynamically stable.   Delay start of Pharmacological VTE agent (>24hrs) due to surgical blood loss or risk of bleeding: not applicable

## 2020-12-06 NOTE — Discharge Instructions (Addendum)
No tylenol until after 3:15pm today. No ibuprofen/motrin until after 5:15pm today.  Post Anesthesia Home Care Instructions  Activity: Get plenty of rest for the remainder of the day. A responsible individual must stay with you for 24 hours following the procedure.  For the next 24 hours, DO NOT: -Drive a car -Paediatric nurse -Drink alcoholic beverages -Take any medication unless instructed by your physician -Make any legal decisions or sign important papers.  Meals: Start with liquid foods such as gelatin or soup. Progress to regular foods as tolerated. Avoid greasy, spicy, heavy foods. If nausea and/or vomiting occur, drink only clear liquids until the nausea and/or vomiting subsides. Call your physician if vomiting continues.  Special Instructions/Symptoms: Your throat may feel dry or sore from the anesthesia or the breathing tube placed in your throat during surgery. If this causes discomfort, gargle with warm salt water. The discomfort should disappear within 24 hours.  If you had a scopolamine patch placed behind your ear for the management of post- operative nausea and/or vomiting:  1. The medication in the patch is effective for 72 hours, after which it should be removed.  Wrap patch in a tissue and discard in the trash. Wash hands thoroughly with soap and water. 2. You may remove the patch earlier than 72 hours if you experience unpleasant side effects which may include dry mouth, dizziness or visual disturbances. 3. Avoid touching the patch. Wash your hands with soap and water after contact with the patch.

## 2020-12-06 NOTE — Anesthesia Preprocedure Evaluation (Signed)
Anesthesia Evaluation  Patient identified by MRN, date of birth, ID band Patient awake    Reviewed: Allergy & Precautions, H&P , NPO status , Patient's Chart, lab work & pertinent test results  History of Anesthesia Complications (+) PONV and history of anesthetic complications  Airway Mallampati: II   Neck ROM: full    Dental   Pulmonary neg pulmonary ROS,    breath sounds clear to auscultation       Cardiovascular hypertension,  Rhythm:regular Rate:Normal     Neuro/Psych    GI/Hepatic   Endo/Other    Renal/GU      Musculoskeletal   Abdominal   Peds  Hematology   Anesthesia Other Findings   Reproductive/Obstetrics                             Anesthesia Physical Anesthesia Plan  ASA: 2  Anesthesia Plan: General   Post-op Pain Management:    Induction: Intravenous  PONV Risk Score and Plan: 4 or greater and Ondansetron, Dexamethasone, Midazolam and Treatment may vary due to age or medical condition  Airway Management Planned: LMA  Additional Equipment:   Intra-op Plan:   Post-operative Plan: Extubation in OR  Informed Consent: I have reviewed the patients History and Physical, chart, labs and discussed the procedure including the risks, benefits and alternatives for the proposed anesthesia with the patient or authorized representative who has indicated his/her understanding and acceptance.     Dental advisory given  Plan Discussed with: CRNA, Anesthesiologist and Surgeon  Anesthesia Plan Comments:         Anesthesia Quick Evaluation

## 2020-12-06 NOTE — H&P (Signed)
REFERRING PHYSICIAN: Lovena Le*  PROVIDER: Landry Corporal, MD  MRN: [id number] DOB: 02-Apr-1965  Subjective   Chief Complaint: No chief complaint on file.   History of Present Illness: Alicia Steele is a 55 y.o. female who is seen today as an office consultation at the request of Dr. Madilyn Fireman for evaluation of No chief complaint on file. .   We are asked to see the patient in consultation by Dr. Laray Anger to evaluate her for atypical ductal hyperplasia of the right breast. The patient is a 55 year old white female who recently went for a routine screening mammogram. At that time she was found to have a 4 small clusters of abnormal calcification in the upper outer quadrant of the right breast. 3 were benign but the fourth had atypical ductal hyperplasia. She has no significant close family history of breast cancer. She does not smoke.  Review of Systems: A complete review of systems was obtained from the patient. I have reviewed this information and discussed as appropriate with the patient. See HPI as well for other ROS.  ROS   Medical History: No past medical history on file.  Patient Active Problem List  Diagnosis   Atypical ductal hyperplasia of right breast   No past surgical history on file.   Not on File  No current outpatient medications on file prior to visit.   No current facility-administered medications on file prior to visit.   No family history on file.   Social History   Tobacco Use  Smoking Status Not on file  Smokeless Tobacco Not on file    Social History   Socioeconomic History   Marital status: Single   Objective:   There were no vitals filed for this visit.  There is no height or weight on file to calculate BMI.  Physical Exam Vitals reviewed.  Constitutional:  General: She is not in acute distress. Appearance: Normal appearance.  HENT:  Head: Normocephalic and atraumatic.  Right Ear: External ear normal.   Left Ear: External ear normal.  Nose: Nose normal.  Mouth/Throat:  Mouth: Mucous membranes are moist.  Pharynx: Oropharynx is clear.  Eyes:  General: No scleral icterus. Extraocular Movements: Extraocular movements intact.  Conjunctiva/sclera: Conjunctivae normal.  Pupils: Pupils are equal, round, and reactive to light.  Cardiovascular:  Rate and Rhythm: Normal rate and regular rhythm.  Pulses: Normal pulses.  Heart sounds: Normal heart sounds.  Pulmonary:  Effort: Pulmonary effort is normal. No respiratory distress.  Breath sounds: Normal breath sounds.  Abdominal:  General: Bowel sounds are normal.  Palpations: Abdomen is soft.  Tenderness: There is no abdominal tenderness.  Musculoskeletal:  General: No swelling, tenderness or deformity. Normal range of motion.  Cervical back: Normal range of motion and neck supple.  Skin: General: Skin is warm and dry.  Coloration: Skin is not jaundiced.  Neurological:  General: No focal deficit present.  Mental Status: She is alert and oriented to person, place, and time.  Psychiatric:  Mood and Affect: Mood normal.  Behavior: Behavior normal.   Breast: There is no palpable mass in either breast. There is no palpable axillary, supraclavicular, or cervical lymphadenopathy  Labs, Imaging and Diagnostic Testing:  Assessment and Plan:  Diagnoses and all orders for this visit:  Atypical ductal hyperplasia of right breast    The patient appears to have a 1 cm area of of atypical ductal hyperplasia in the upper outer quadrant of the right breast. Because this can have an appearance similar to  ductal carcinoma in situ and because there is a 5 to 10% chance of missing something more significant I would recommend that this area be removed. She would also like to have that done. I have discussed with her in detail the risks and benefits of the operation as well as some of the technical aspects including the use of a radioactive seed for  localization and she understands and wishes to proceed. I will also refer her to the high risk clinic at the cancer center to talk about risk reduction.

## 2020-12-06 NOTE — Anesthesia Procedure Notes (Signed)
Procedure Name: LMA Insertion Date/Time: 12/06/2020 9:51 AM Performed by: Albertha Ghee, MD Pre-anesthesia Checklist: Patient identified, Emergency Drugs available, Suction available and Patient being monitored Patient Re-evaluated:Patient Re-evaluated prior to induction Oxygen Delivery Method: Circle System Utilized Preoxygenation: Pre-oxygenation with 100% oxygen Induction Type: IV induction Ventilation: Mask ventilation without difficulty LMA: LMA inserted LMA Size: 4.0 Number of attempts: 1 Airway Equipment and Method: Bite block Placement Confirmation: positive ETCO2 Tube secured with: Tape Dental Injury: Teeth and Oropharynx as per pre-operative assessment

## 2020-12-06 NOTE — Interval H&P Note (Signed)
History and Physical Interval Note:  12/06/2020 9:23 AM  Alicia Steele  has presented today for surgery, with the diagnosis of RIGHT BREAST ADH.  The various methods of treatment have been discussed with the patient and family. After consideration of risks, benefits and other options for treatment, the patient has consented to  Procedure(s): RIGHT BREAST LUMPECTOMY WITH RADIOACTIVE SEED LOCALIZATION (Right) as a surgical intervention.  The patient's history has been reviewed, patient examined, no change in status, stable for surgery.  I have reviewed the patient's chart and labs.  Questions were answered to the patient's satisfaction.     [name]

## 2020-12-07 ENCOUNTER — Encounter (HOSPITAL_BASED_OUTPATIENT_CLINIC_OR_DEPARTMENT_OTHER): Payer: Self-pay | Admitting: General Surgery

## 2020-12-07 LAB — SURGICAL PATHOLOGY

## 2020-12-14 ENCOUNTER — Other Ambulatory Visit: Payer: Self-pay | Admitting: Family Medicine

## 2021-01-11 ENCOUNTER — Other Ambulatory Visit: Payer: Self-pay | Admitting: Family Medicine

## 2021-01-11 DIAGNOSIS — I10 Essential (primary) hypertension: Secondary | ICD-10-CM

## 2021-01-11 NOTE — Telephone Encounter (Signed)
LVM for patient to call back to schedule appt. AM

## 2021-01-11 NOTE — Telephone Encounter (Signed)
Please call pt and inform her that she is due for f/u for her BP medication. She can do a virtual however, SHE MUST BE ABLE TO GIVE [place] AT LEAST 2 WKS OF BP READINGS, and current weight if she does a virtual visit.  Thanks.   Will send 30 day supply

## 2021-02-27 ENCOUNTER — Encounter: Payer: Self-pay | Admitting: Family Medicine

## 2021-02-27 ENCOUNTER — Telehealth (INDEPENDENT_AMBULATORY_CARE_PROVIDER_SITE_OTHER): Payer: 59 | Admitting: Family Medicine

## 2021-02-27 VITALS — BP 121/76 | HR 94 | Ht 65.0 in | Wt 204.0 lb

## 2021-02-27 DIAGNOSIS — F0630 Mood disorder due to known physiological condition, unspecified: Secondary | ICD-10-CM

## 2021-02-27 DIAGNOSIS — Z1211 Encounter for screening for malignant neoplasm of colon: Secondary | ICD-10-CM

## 2021-02-27 DIAGNOSIS — E282 Polycystic ovarian syndrome: Secondary | ICD-10-CM | POA: Diagnosis not present

## 2021-02-27 DIAGNOSIS — F439 Reaction to severe stress, unspecified: Secondary | ICD-10-CM

## 2021-02-27 DIAGNOSIS — I10 Essential (primary) hypertension: Secondary | ICD-10-CM | POA: Diagnosis not present

## 2021-02-27 DIAGNOSIS — R1310 Dysphagia, unspecified: Secondary | ICD-10-CM

## 2021-02-27 MED ORDER — METFORMIN HCL ER 500 MG PO TB24: ORAL_TABLET | ORAL | 1 refills | Status: DC

## 2021-02-27 MED ORDER — LOSARTAN POTASSIUM-HCTZ 100-25 MG PO TABS: 1.0000 | ORAL_TABLET | Freq: Every day | ORAL | 1 refills | Status: DC

## 2021-02-27 MED ORDER — AMLODIPINE BESYLATE 5 MG PO TABS: 5.0000 mg | ORAL_TABLET | Freq: Every day | ORAL | 1 refills | Status: DC

## 2021-02-27 MED ORDER — CITALOPRAM HYDROBROMIDE 40 MG PO TABS: 40.0000 mg | ORAL_TABLET | Freq: Every day | ORAL | 1 refills | Status: DC

## 2021-02-27 MED ORDER — FUROSEMIDE 40 MG PO TABS: ORAL_TABLET | ORAL | 0 refills | Status: DC

## 2021-02-27 NOTE — Assessment & Plan Note (Signed)
Well controlled. Continue current regimen. Follow up in  6 mo  

## 2021-02-27 NOTE — Assessment & Plan Note (Signed)
Continue with metformin and check A1C.

## 2021-02-27 NOTE — Progress Notes (Signed)
Virtual Visit via Telephone Note  I connected with Alicia Steele on 02/27/21 at  9:50 AM EST by telephone and verified that I am speaking with the correct person using two identifiers.   I discussed the limitations, risks, security and privacy concerns of performing an evaluation and management service by telephone and the availability of in person appointments. I also discussed with the patient that there may be a patient responsible charge related to this service. The patient expressed understanding and agreed to proceed.  Patient location: at home  Provider loccation: In office   Subjective:    CC:   Chief Complaint  Patient presents with   Hypertension    HPI:  Hypertension- Pt denies chest pain, SOB, dizziness, or heart palpitations.  Taking meds as directed w/o problems.  Denies medication side effects.  Using her lasix about 2 x per week. She has been wearing compression stocking.    Had left breast lumpectomy in September.  Negative, but she is high risk for lifetime BrCa. Surgeon recommend referral to cancer center.   Having some problems with her son who has been disrespectful.   Past medical history, Surgical history, Family history not pertinant except as noted below, Social history, Allergies, and medications have been entered into the medical record, reviewed, and corrections made.   Review of Systems: No fevers, chills, night sweats, weight loss, chest pain, or shortness of breath.   Objective:    General: Speaking clearly in complete sentences without any shortness of breath.  Alert and oriented x3.  Normal judgment. No apparent acute distress.    Impression and Recommendations:    Problem List Items Addressed This Visit       Cardiovascular and Mediastinum   HYPERTENSION, BENIGN - Primary    Well controlled. Continue current regimen. Follow up in  6 mo       Relevant Medications   losartan-hydrochlorothiazide (HYZAAR) 100-25 MG tablet    furosemide (LASIX) 40 MG tablet   amLODipine (NORVASC) 5 MG tablet   Other Relevant Orders   BASIC METABOLIC PANEL WITH GFR     Endocrine   POLYCYSTIC OVARIAN DISEASE    Continue with metformin and check A1C.      Relevant Medications   metFORMIN (GLUCOPHAGE-XR) 500 MG 24 hr tablet   Other Relevant Orders   Hemoglobin A1c     Other   Mood disorder in conditions classified elsewhere    Continue citalopram.        Relevant Medications   citalopram (CELEXA) 40 MG tablet   Other Visit Diagnoses     Screening for colon cancer       Stress       Dysphagia, unspecified type       Relevant Orders   Ambulatory referral to Gastroenterology   Screen for colon cancer       Relevant Orders   Ambulatory referral to Gastroenterology       Meds ordered this encounter  Medications   losartan-hydrochlorothiazide (HYZAAR) 100-25 MG tablet    Sig: Take 1 tablet by mouth daily.    Dispense:  90 tablet    Refill:  1   furosemide (LASIX) 40 MG tablet    Sig: TAKE ONE TABLET BY MOUTH DAILY AS NEEDED FOR EDEMA Strength: 40 mg    Dispense:  90 tablet    Refill:  0   citalopram (CELEXA) 40 MG tablet    Sig: Take 1 tablet (40 mg total) by mouth daily.  Dispense:  90 tablet    Refill:  1   metFORMIN (GLUCOPHAGE-XR) 500 MG 24 hr tablet    Sig: TAKE TWO TABLETS BY MOUTH DAILY WITH BREAKFAST    Dispense:  180 tablet    Refill:  1   amLODipine (NORVASC) 5 MG tablet    Sig: Take 1 tablet (5 mg total) by mouth daily.    Dispense:  90 tablet    Refill:  1    Meds ordered this encounter  Medications   losartan-hydrochlorothiazide (HYZAAR) 100-25 MG tablet    Sig: Take 1 tablet by mouth daily.    Dispense:  90 tablet    Refill:  1   furosemide (LASIX) 40 MG tablet    Sig: TAKE ONE TABLET BY MOUTH DAILY AS NEEDED FOR EDEMA Strength: 40 mg    Dispense:  90 tablet    Refill:  0   citalopram (CELEXA) 40 MG tablet    Sig: Take 1 tablet (40 mg total) by mouth daily.    Dispense:  90  tablet    Refill:  1   metFORMIN (GLUCOPHAGE-XR) 500 MG 24 hr tablet    Sig: TAKE TWO TABLETS BY MOUTH DAILY WITH BREAKFAST    Dispense:  180 tablet    Refill:  1   amLODipine (NORVASC) 5 MG tablet    Sig: Take 1 tablet (5 mg total) by mouth daily.    Dispense:  90 tablet    Refill:  1     I discussed the assessment and treatment plan with the patient. The patient was provided an opportunity to ask questions and all were answered. The patient agreed with the plan and demonstrated an understanding of the instructions.   The patient was advised to call back or seek an in-person evaluation if the symptoms worsen or if the condition fails to improve as anticipated.  I provided 22 minutes of non-face-to-face time during this encounter.   Beatrice Lecher, MD

## 2021-02-27 NOTE — Assessment & Plan Note (Signed)
-

## 2021-06-26 ENCOUNTER — Encounter (HOSPITAL_COMMUNITY): Payer: Self-pay

## 2021-08-30 ENCOUNTER — Other Ambulatory Visit: Payer: Self-pay | Admitting: General Surgery

## 2021-08-30 DIAGNOSIS — Z1239 Encounter for other screening for malignant neoplasm of breast: Secondary | ICD-10-CM

## 2021-09-17 ENCOUNTER — Other Ambulatory Visit: Payer: Self-pay | Admitting: Family Medicine

## 2021-09-17 DIAGNOSIS — E282 Polycystic ovarian syndrome: Secondary | ICD-10-CM

## 2021-09-17 DIAGNOSIS — F0630 Mood disorder due to known physiological condition, unspecified: Secondary | ICD-10-CM

## 2021-09-17 DIAGNOSIS — I10 Essential (primary) hypertension: Secondary | ICD-10-CM

## 2021-09-18 NOTE — Telephone Encounter (Signed)
Please call pt and advise her that she is due for an appt and fasting labs. Refills sent for now.

## 2021-09-18 NOTE — Telephone Encounter (Signed)
LVM for patient to call back to get appt/fasting labs with Dr Madilyn Fireman scheduled. AMUCK

## 2021-09-24 ENCOUNTER — Other Ambulatory Visit: Payer: Self-pay

## 2021-09-25 ENCOUNTER — Other Ambulatory Visit: Payer: Self-pay | Admitting: Family Medicine

## 2021-09-25 DIAGNOSIS — I10 Essential (primary) hypertension: Secondary | ICD-10-CM

## 2021-09-25 NOTE — Telephone Encounter (Signed)
Please call pt she is due for an appointment and labs she didn't go for the labs that were ordered for her in December. She is now due for FASTING labs.

## 2021-09-25 NOTE — Telephone Encounter (Signed)
Phone went straight to voicemail. Left voicemail for patient to call back to schedule appt with Dr Madilyn Fireman and fasting labs appt. AMUCK

## 2021-11-01 ENCOUNTER — Other Ambulatory Visit: Payer: Self-pay | Admitting: Family Medicine

## 2021-11-01 DIAGNOSIS — I10 Essential (primary) hypertension: Secondary | ICD-10-CM

## 2021-11-06 NOTE — Telephone Encounter (Signed)
Last Office Visit 02/27/2021  Return in about 6 months (around 08/28/2021) for Hypertension and Mood. Not scheduled  Last filled 09/25/2021  Upcoming appointment 11/14/2021

## 2021-11-14 ENCOUNTER — Telehealth: Payer: Self-pay | Admitting: Family Medicine

## 2021-11-19 ENCOUNTER — Telehealth: Payer: Self-pay | Admitting: Family Medicine

## 2021-11-19 NOTE — Progress Notes (Unsigned)
     Virtual Visit via Video Note  I connected with Alicia Steele on 11/19/21 at  3:20 PM EDT by a video enabled telemedicine application and verified that I am speaking with the correct person using two identifiers.   I discussed the limitations of evaluation and management by telemedicine and the availability of in person appointments. The patient expressed understanding and agreed to proceed.  Patient location: at home Provider location: in office  Subjective:    CC:  No chief complaint on file.   HPI:   Hypertension- Pt denies chest pain, SOB, dizziness, or heart palpitations.  Taking meds as directed w/o problems.  Denies medication side effects.    F/U PCOS -is currently on metformin 500 mg daily.  Mood disorder-currently on Celexa 40 mg daily   Past medical history, Surgical history, Family history not pertinant except as noted below, Social history, Allergies, and medications have been entered into the medical record, reviewed, and corrections made.    Objective:    General: Speaking clearly in complete sentences without any shortness of breath.  Alert and oriented x3.  Normal judgment. No apparent acute distress.    Impression and Recommendations:    Problem List Items Addressed This Visit       Cardiovascular and Mediastinum   HYPERTENSION, BENIGN - Primary     Endocrine   POLYCYSTIC OVARIAN DISEASE     Other   Mood disorder in conditions classified elsewhere   Hyperlipemia    No orders of the defined types were placed in this encounter.   No orders of the defined types were placed in this encounter.    I discussed the assessment and treatment plan with the patient. The patient was provided an opportunity to ask questions and all were answered. The patient agreed with the plan and demonstrated an understanding of the instructions.   The patient was advised to call back or seek an in-person evaluation if the symptoms worsen or if the condition  fails to improve as anticipated.   Nani Gasser, MD

## 2021-11-20 ENCOUNTER — Other Ambulatory Visit: Payer: Self-pay | Admitting: *Deleted

## 2021-11-20 DIAGNOSIS — I10 Essential (primary) hypertension: Secondary | ICD-10-CM

## 2021-11-20 DIAGNOSIS — E282 Polycystic ovarian syndrome: Secondary | ICD-10-CM

## 2021-11-20 DIAGNOSIS — E785 Hyperlipidemia, unspecified: Secondary | ICD-10-CM

## 2021-11-20 DIAGNOSIS — F0630 Mood disorder due to known physiological condition, unspecified: Secondary | ICD-10-CM

## 2021-11-21 DIAGNOSIS — E785 Hyperlipidemia, unspecified: Secondary | ICD-10-CM | POA: Diagnosis not present

## 2021-11-21 DIAGNOSIS — E282 Polycystic ovarian syndrome: Secondary | ICD-10-CM | POA: Diagnosis not present

## 2021-11-21 DIAGNOSIS — R69 Illness, unspecified: Secondary | ICD-10-CM | POA: Diagnosis not present

## 2021-11-21 DIAGNOSIS — I10 Essential (primary) hypertension: Secondary | ICD-10-CM | POA: Diagnosis not present

## 2021-11-22 ENCOUNTER — Telehealth (INDEPENDENT_AMBULATORY_CARE_PROVIDER_SITE_OTHER): Payer: Self-pay | Admitting: Family Medicine

## 2021-11-22 ENCOUNTER — Telehealth: Payer: Self-pay | Admitting: Family Medicine

## 2021-11-22 ENCOUNTER — Encounter: Payer: Self-pay | Admitting: Family Medicine

## 2021-11-22 VITALS — BP 138/93 | HR 113 | Wt 218.0 lb

## 2021-11-22 DIAGNOSIS — F0630 Mood disorder due to known physiological condition, unspecified: Secondary | ICD-10-CM

## 2021-11-22 DIAGNOSIS — F349 Persistent mood [affective] disorder, unspecified: Secondary | ICD-10-CM

## 2021-11-22 DIAGNOSIS — E782 Mixed hyperlipidemia: Secondary | ICD-10-CM

## 2021-11-22 DIAGNOSIS — R7401 Elevation of levels of liver transaminase levels: Secondary | ICD-10-CM

## 2021-11-22 DIAGNOSIS — I10 Essential (primary) hypertension: Secondary | ICD-10-CM

## 2021-11-22 DIAGNOSIS — E282 Polycystic ovarian syndrome: Secondary | ICD-10-CM

## 2021-11-22 LAB — CBC
HCT: 41.7 % (ref 35.0–45.0)
Hemoglobin: 14.3 g/dL (ref 11.7–15.5)
MCH: 31.2 pg (ref 27.0–33.0)
MCHC: 34.3 g/dL (ref 32.0–36.0)
MCV: 90.8 fL (ref 80.0–100.0)
MPV: 10.2 fL (ref 7.5–12.5)
Platelets: 381 10*3/uL (ref 140–400)
RBC: 4.59 10*6/uL (ref 3.80–5.10)
RDW: 13 % (ref 11.0–15.0)
WBC: 6.4 10*3/uL (ref 3.8–10.8)

## 2021-11-22 LAB — COMPLETE METABOLIC PANEL WITH GFR
AG Ratio: 1.5 (calc) (ref 1.0–2.5)
ALT: 34 U/L — ABNORMAL HIGH (ref 6–29)
AST: 35 U/L (ref 10–35)
Albumin: 4.5 g/dL (ref 3.6–5.1)
Alkaline phosphatase (APISO): 83 U/L (ref 37–153)
BUN: 14 mg/dL (ref 7–25)
CO2: 28 mmol/L (ref 20–32)
Calcium: 9.7 mg/dL (ref 8.6–10.4)
Chloride: 99 mmol/L (ref 98–110)
Creat: 0.65 mg/dL (ref 0.50–1.03)
Globulin: 3 g/dL (calc) (ref 1.9–3.7)
Glucose, Bld: 107 mg/dL — ABNORMAL HIGH (ref 65–99)
Potassium: 4.5 mmol/L (ref 3.5–5.3)
Sodium: 136 mmol/L (ref 135–146)
Total Bilirubin: 0.5 mg/dL (ref 0.2–1.2)
Total Protein: 7.5 g/dL (ref 6.1–8.1)
eGFR: 104 mL/min/{1.73_m2} (ref >=60)

## 2021-11-22 LAB — HEMOGLOBIN A1C
Hgb A1c MFr Bld: 5.5 % of total Hgb (ref <5.7)
Mean Plasma Glucose: 111 mg/dL
eAG (mmol/L): 6.2 mmol/L

## 2021-11-22 LAB — LIPID PANEL W/REFLEX DIRECT LDL
Cholesterol: 325 mg/dL — ABNORMAL HIGH (ref <200)
HDL: 68 mg/dL (ref >=50)
LDL Cholesterol (Calc): 198 mg/dL (calc) — ABNORMAL HIGH
Non-HDL Cholesterol (Calc): 257 mg/dL (calc) — ABNORMAL HIGH (ref <130)
Total CHOL/HDL Ratio: 4.8 (calc) (ref <5.0)
Triglycerides: 349 mg/dL — ABNORMAL HIGH (ref <150)

## 2021-11-22 MED ORDER — LOSARTAN POTASSIUM-HCTZ 100-25 MG PO TABS: 1.0000 | ORAL_TABLET | Freq: Every day | ORAL | 1 refills | Status: DC

## 2021-11-22 MED ORDER — AMLODIPINE BESYLATE 5 MG PO TABS: 5.0000 mg | ORAL_TABLET | Freq: Every day | ORAL | 1 refills | Status: DC

## 2021-11-22 MED ORDER — CITALOPRAM HYDROBROMIDE 40 MG PO TABS: 40.0000 mg | ORAL_TABLET | Freq: Every day | ORAL | 1 refills | Status: DC

## 2021-11-22 MED ORDER — PANTOPRAZOLE SODIUM 40 MG PO TBEC: 40.0000 mg | DELAYED_RELEASE_TABLET | Freq: Every day | ORAL | 3 refills | Status: DC

## 2021-11-22 MED ORDER — FUROSEMIDE 40 MG PO TABS: ORAL_TABLET | ORAL | 2 refills | Status: DC

## 2021-11-22 MED ORDER — METFORMIN HCL ER 500 MG PO TB24: ORAL_TABLET | ORAL | 1 refills | Status: DC

## 2021-11-22 NOTE — Assessment & Plan Note (Signed)
Plans on cutting back on beer and plan to recheck liver enzymes in 1 month.

## 2021-11-22 NOTE — Assessment & Plan Note (Addendum)
The importance of starting a cholesterol medication if she is extremely high risk with her current LDL of  198.  She wants to get back on her fiber supplement and see if that helps and hopefully start exercising again.  So plan will be to recheck lipids in 3 to 4 months but if not at goal strongly encouraged her to consider a statin.  Lipid Panel     Component Value Date/Time   CHOL 325 (H) 11/21/2021 0000   TRIG 349 (H) 11/21/2021 0000   HDL 68 11/21/2021 0000   CHOLHDL 4.8 11/21/2021 0000   VLDL 48 (H) 10/22/2015 1206   LDLCALC 198 (H) 11/21/2021 0000

## 2021-11-22 NOTE — Progress Notes (Signed)
Virtual Visit via Video Note  I connected with Alicia Steele on 11/22/21 at  3:20 PM EDT by a video enabled telemedicine application and verified that I am speaking with the correct person using two identifiers.   I discussed the limitations of evaluation and management by telemedicine and the availability of in person appointments. The patient expressed understanding and agreed to proceed.  Patient location: at home Provider location: in office    Established Patient Office Visit  Subjective   Patient ID: Alicia Steele, female    DOB: [birth year]  Age: 56 y.o. MRN: [id number]  Chief Complaint  Patient presents with   Medication Refill    HPI  Hypertension- Pt denies chest pain, SOB, dizziness, or heart palpitations.  Taking meds as directed w/o problems.  Denies medication side effects.    F/U Mood -currently on Celexa 40 mg daily. She is doing really want.  Did therapy earlier this year.  Labs including lipids were completed yesterday.  Important highlights were that her elevated ALT was just slightly elevated recommend repeat in 1 month.  Also cholesterol is extremely high and she is not currently on cholesterol medication. She plans on cuttting back on beer.   Hasn't been able to exercise and walk bc of her foot she has problems with the cuboid bone and says it is getting tremendously better but has not been able to get back to exercise yet.\\  Hyperlipidemia-discussed recent lipid results.  She says she did come off her fiber that she had been taking pretty regularly.    ROS    Objective:     BP (!) 138/93   Pulse (!) 113   Wt 218 lb (98.9 kg)   BMI 36.28 kg/m    Physical Exam   No results found for any visits on 11/22/21.    The ASCVD Risk score (Arnett DK, et al., 2019) failed to calculate for the following reasons:   The valid total cholesterol range is 130 to 320 mg/dL    Assessment & Plan:   Problem List Items Addressed This Visit        Cardiovascular and Mediastinum   HYPERTENSION, BENIGN    BP not at goal today.  Discussed goal.  She will track pressures at home and let me know if they are staying in the 130s.  Uses her lasix  About 3 days per week.  Labs are up to day.        Relevant Medications   losartan-hydrochlorothiazide (HYZAAR) 100-25 MG tablet   furosemide (LASIX) 40 MG tablet   amLODipine (NORVASC) 5 MG tablet     Endocrine   POLYCYSTIC OVARIAN DISEASE   Relevant Medications   metFORMIN (GLUCOPHAGE-XR) 500 MG 24 hr tablet     Other   Mood disorder in conditions classified elsewhere - Primary    Doing well on citalopram.  She is happy with her current regimen and does not want to make any changes today.      Relevant Medications   citalopram (CELEXA) 40 MG tablet   Hyperlipemia    The importance of starting a cholesterol medication if she is extremely high risk with her current LDL of  198.  She wants to get back on her fiber supplement and see if that helps and hopefully start exercising again.  So plan will be to recheck lipids in 3 to 4 months but if not at goal strongly encouraged her to consider a statin.  Lipid Panel  Component Value Date/Time   CHOL 325 (H) 11/21/2021 0000   TRIG 349 (H) 11/21/2021 0000   HDL 68 11/21/2021 0000   CHOLHDL 4.8 11/21/2021 0000   VLDL 48 (H) 10/22/2015 1206   LDLCALC 198 (H) 11/21/2021 0000         Relevant Medications   losartan-hydrochlorothiazide (HYZAAR) 100-25 MG tablet   furosemide (LASIX) 40 MG tablet   amLODipine (NORVASC) 5 MG tablet   Elevated ALT measurement    Plans on cutting back on beer and plan to recheck liver enzymes in 1 month.       No follow-ups on file.    I discussed the assessment and treatment plan with the patient. The patient was provided an opportunity to ask questions and all were answered. The patient agreed with the plan and demonstrated an understanding of the instructions.   The patient was advised to call back  or seek an in-person evaluation if the symptoms worsen or if the condition fails to improve as anticipated.   Beatrice Lecher, MD

## 2021-11-22 NOTE — Progress Notes (Signed)
Hi Alicia Steele, your metabolic panel overall looks good except your ALT liver enzyme jumped up just slightly.  I do want to keep an eye on this and plan to recheck a liver panel in about a month.  Just make sure avoiding any excess Tylenol and working on healthy diet.  It can make a big difference in inflammation in the liver.  LDL cholesterol is extremely high based on your numbers, I would highly recommend a statin.  Your risk for cardiovascular disease is very high based on your risk score.  This would reduce your risk for heart attack and stroke over the next 10 years.  Please let me know if you are okay with sending the medication to your pharmacy.  A1c and blood count look great.  Lease let us know when your last Pap smear was so that we can get your chart updated.

## 2021-11-22 NOTE — Assessment & Plan Note (Addendum)
BP not at goal today.  Discussed goal.  She will track pressures at home and let me know if they are staying in the 130s.  Uses her lasix  About 3 days per week.  Labs are up to day.

## 2021-11-22 NOTE — Assessment & Plan Note (Signed)
Doing well on citalopram.  She is happy with her current regimen and does not want to make any changes today.

## 2021-12-01 ENCOUNTER — Other Ambulatory Visit: Payer: Self-pay | Admitting: Family Medicine

## 2021-12-01 DIAGNOSIS — I10 Essential (primary) hypertension: Secondary | ICD-10-CM

## 2021-12-02 ENCOUNTER — Other Ambulatory Visit: Payer: Self-pay | Admitting: Family Medicine

## 2021-12-02 DIAGNOSIS — I10 Essential (primary) hypertension: Secondary | ICD-10-CM

## 2022-01-27 ENCOUNTER — Telehealth: Payer: Self-pay | Admitting: *Deleted

## 2022-01-27 DIAGNOSIS — Z7689 Persons encountering health services in other specified circumstances: Secondary | ICD-10-CM

## 2022-01-27 NOTE — Telephone Encounter (Signed)
Pt called and stated that she and Dr. Madilyn Fireman discussed that she could start taking Monjuarno for weight loss. Pt said that she is ready to start this now and would like this to be sent to [facility] in [facility].

## 2022-01-28 MED ORDER — TIRZEPATIDE 2.5 MG/0.5ML ~~LOC~~ SOAJ: 2.5000 mg | SUBCUTANEOUS | 0 refills | Status: DC

## 2022-01-28 NOTE — Telephone Encounter (Signed)
Meds ordered this encounter  Medications   tirzepatide (MOUNJARO) 2.5 MG/0.5ML Pen    Sig: Inject 2.5 mg into the skin once a week.    Dispense:  2 mL    Refill:  0   Will need visit in 4 weeks.  OK for in person or vitual

## 2022-02-04 NOTE — Telephone Encounter (Signed)
Please call patient for appt in 4 weeks with Dr. Madilyn Fireman.

## 2022-02-04 NOTE — Telephone Encounter (Signed)
Lvm for patient to call back to schedule a appointment for a 4 week follow up with Dr. Madilyn Fireman. tvt

## 2022-03-11 ENCOUNTER — Telehealth: Payer: Self-pay

## 2022-03-11 NOTE — Telephone Encounter (Addendum)
Initiated Prior authorization SNK:[id number] [facility]) 2.5 MG/0.5ML Pen Via: Covermymedstirzepatide (MOUNJARO) 2.5 MG/0.5ML Pen Case/Key:[id number] Status: denied  as of 03/11/22 Reason:[employer], the utilization review entity for [employer] [facility], received a request for coverage of Mounjaro 2.5/0.5 Pen for you. We've denied the request for the following reason(s): Coverage for this medication is denied for the following reason(s). We reviewed the information we received about your condition and circumstances. We used the plan approved policy when making this decision. The policy states that this medication may be approved when: -The member has a clinical condition or needs a specific dosage form for which there is no alternative on the formulary OR -The listed formulary alternatives are not recommended based on published guidelines or clinical literature OR -The formulary alternatives will likely be ineffective or less effective for the member OR -The formulary alternatives will likely cause an adverse effect OR -The member is unable to take the required number of formulary alternatives for the given diagnosis due to a trial and inadequate treatment response or contraindication OR -The member has tried and failed the required number of formulary alternatives. Based on the policy and the information we have, your request is denied. We did not receive any documentation that you meet any of the criteria outlined above. Formulary alternative(s) are Trulicity, Victoza. Requirement: 3 in a class with 3 or more alternatives, 2 in a class with 2 alternatives, or 1 in a class with only 1 alternative. Please refer to your plan documents for a complete list of alternatives. Note: Formulary alternatives may require a prior authorization. Your prescriber will be responsible for determining what alternative is appropriate for you Notified Pt via: Mychart  Pt has New  insurance pt states she has [employer] number [id number] [phone]

## 2022-03-24 NOTE — Telephone Encounter (Signed)
Call patient and let her know that insurance will  not cover Mounjaro.

## 2022-03-24 NOTE — Addendum Note (Signed)
Addended by: Beatrice Lecher D on: 03/24/2022 04:22 PM   Modules accepted: Orders

## 2022-03-27 NOTE — Telephone Encounter (Signed)
We can proceed but working to need an updated A1c.  It has been 4 months since her last one please see if she could come by and have that done either via a nurse visit with fingerstick or we can order venipuncture either way.  Also please let us know if she has had a flu shot somewhere so we can get her chart updated and if she has had her Pap smear.

## 2022-03-27 NOTE — Telephone Encounter (Signed)
No pap smear done.   She stated that she has done a different Tier on her insurance and she stated that they first stated that they would pay for this.   Pt scheduled for tomorrow @ 10 AM

## 2022-03-28 ENCOUNTER — Ambulatory Visit (INDEPENDENT_AMBULATORY_CARE_PROVIDER_SITE_OTHER): Payer: 59 | Admitting: Family Medicine

## 2022-03-28 VITALS — BP 138/81 | HR 94 | Ht 65.0 in | Wt 233.0 lb

## 2022-03-28 DIAGNOSIS — R7301 Impaired fasting glucose: Secondary | ICD-10-CM

## 2022-03-28 DIAGNOSIS — E282 Polycystic ovarian syndrome: Secondary | ICD-10-CM | POA: Diagnosis not present

## 2022-03-28 DIAGNOSIS — Z7689 Persons encountering health services in other specified circumstances: Secondary | ICD-10-CM

## 2022-03-28 LAB — POCT GLYCOSYLATED HEMOGLOBIN (HGB A1C): Hemoglobin A1C: 6.1 % — AB (ref 4.0–5.6)

## 2022-03-28 NOTE — Progress Notes (Signed)
   Subjective:    Patient ID: Alicia Steele, female    DOB: May 14, 1965, 57 y.o.   MRN: [id number]  HPI Pt here for A1C   Review of Systems     Objective:   Physical Exam        Assessment & Plan:  Pts A1C today was 6.1, pt advised to continue metformin as prescribed and we will submit updated A1C to insurance for approval of an alternative medication.

## 2022-03-31 DIAGNOSIS — R7301 Impaired fasting glucose: Secondary | ICD-10-CM | POA: Insufficient documentation

## 2022-03-31 MED ORDER — VICTOZA 18 MG/3ML ~~LOC~~ SOPN: PEN_INJECTOR | SUBCUTANEOUS | 0 refills | Status: DC

## 2022-03-31 NOTE — Progress Notes (Signed)
Impaired fasting glucose-we have not had an up-to-date A1c so I asked her to come in today so that we can get that updated at 6.1.  Alicia Steele was not covered by the insurance.  Trulicity is currently on backorder and that leaves for Victoza. and that is only if the insurance will cover it with a diagnosis of prediabetes instead of diabetes.  Meds ordered this encounter  Medications   liraglutide (VICTOZA) 18 MG/3ML SOPN    Sig: Start 0.'6mg'$  SQ once a day for 7 days, then increase to 1.'2mg'$  once a day Diagnosis: Impaired Fasting glucose    Dispense:  6 mL    Refill:  0

## 2022-05-07 ENCOUNTER — Telehealth: Payer: Self-pay

## 2022-05-07 NOTE — Telephone Encounter (Signed)
Initiated Prior authorization [id number] '18MG'$ /3ML pen-injectors Via: Covermymeds Case/Key:[id number] Status: approved as of 05/07/22 Reason:Authorization Expiration Date: 05/07/2023  Notified Pt via: Mychart

## 2022-05-23 ENCOUNTER — Ambulatory Visit: Payer: 59 | Admitting: Sports Medicine

## 2022-05-23 ENCOUNTER — Encounter: Payer: Self-pay | Admitting: Sports Medicine

## 2022-05-23 VITALS — BP 134/85 | HR 80

## 2022-05-23 DIAGNOSIS — R7301 Impaired fasting glucose: Secondary | ICD-10-CM | POA: Diagnosis not present

## 2022-05-23 DIAGNOSIS — L237 Allergic contact dermatitis due to plants, except food: Secondary | ICD-10-CM | POA: Diagnosis not present

## 2022-05-23 MED ORDER — TRIAMCINOLONE ACETONIDE 0.5 % EX OINT: 1.0000 | TOPICAL_OINTMENT | Freq: Two times a day (BID) | CUTANEOUS | 3 refills | Status: DC

## 2022-05-23 MED ORDER — RYBELSUS 7 MG PO TABS: 7.0000 mg | ORAL_TABLET | Freq: Every day | ORAL | 0 refills | Status: DC

## 2022-05-23 MED ORDER — RYBELSUS 3 MG PO TABS: 1.0000 | ORAL_TABLET | Freq: Every day | ORAL | 0 refills | Status: DC

## 2022-05-23 MED ORDER — ONDANSETRON 8 MG PO TBDP: 8.0000 mg | ORAL_TABLET | Freq: Three times a day (TID) | ORAL | 3 refills | Status: DC | PRN

## 2022-05-23 NOTE — Progress Notes (Signed)
    Procedures performed today:    None.  Independent interpretation of notes and tests performed by another provider:   None.  Brief History, Exam, Impression, and Recommendations:    IFG (impaired fasting glucose) Pleasant 57 year old female, impaired fasting glucose with PCOS, prediabetic, did not tolerate metformin, she was placed on Victoza, it sounds like she had excessive nausea when going up to the second dose of Victoza. We will go ahead and switch to Rybelsus and see if we can get it approved, I will give her some Rybelsus samples in the meantime, she understands that nausea and bloating, acid reflux are expected side effects from the entire class of medications. We will also give her some Zofran to reduce symptoms in the meantime.  Poison ivy dermatitis Intensely pruritic rash, linear, erythematous and papular both ankles. Adding topical triamcinolone.    ____________________________________________ Gwen Her. Dianah Field, M.D., ABFM., CAQSM., AME. Primary Care and Sports Medicine [facility]  Adjunct Professor of [employer]  Risk manager

## 2022-05-23 NOTE — Assessment & Plan Note (Signed)
Pleasant 57 year old female, impaired fasting glucose with PCOS, prediabetic, did not tolerate metformin, she was placed on Victoza, it sounds like she had excessive nausea when going up to the second dose of Victoza. We will go ahead and switch to Rybelsus and see if we can get it approved, I will give her some Rybelsus samples in the meantime, she understands that nausea and bloating, acid reflux are expected side effects from the entire class of medications. We will also give her some Zofran to reduce symptoms in the meantime.

## 2022-05-23 NOTE — Assessment & Plan Note (Signed)
Intensely pruritic rash, linear, erythematous and papular both ankles. Adding topical triamcinolone.

## 2022-06-10 ENCOUNTER — Other Ambulatory Visit: Payer: Self-pay | Admitting: Family Medicine

## 2022-06-10 DIAGNOSIS — I10 Essential (primary) hypertension: Secondary | ICD-10-CM

## 2022-06-20 ENCOUNTER — Ambulatory Visit (INDEPENDENT_AMBULATORY_CARE_PROVIDER_SITE_OTHER): Payer: 59 | Admitting: Family Medicine

## 2022-06-20 ENCOUNTER — Telehealth: Payer: Self-pay | Admitting: Family Medicine

## 2022-06-20 ENCOUNTER — Encounter: Payer: Self-pay | Admitting: Family Medicine

## 2022-06-20 VITALS — BP 120/72 | HR 103 | Ht 65.0 in | Wt 231.0 lb

## 2022-06-20 DIAGNOSIS — I10 Essential (primary) hypertension: Secondary | ICD-10-CM

## 2022-06-20 DIAGNOSIS — R7301 Impaired fasting glucose: Secondary | ICD-10-CM | POA: Diagnosis not present

## 2022-06-20 DIAGNOSIS — M79673 Pain in unspecified foot: Secondary | ICD-10-CM | POA: Diagnosis not present

## 2022-06-20 NOTE — Telephone Encounter (Signed)
Please call Dr. Billey Chang office with [facility].  They had tried to schedule patient for breast MRI back in June but evidently it was out of network at [facility] imaging.  They said they would call her back and try to get her scheduled elsewhere she says she never heard from them.  Please call their office and see if they would be able to get her scheduled and set up at a different location for her breast MRI.

## 2022-06-20 NOTE — Assessment & Plan Note (Signed)
Pressure looks great today. 

## 2022-06-20 NOTE — Assessment & Plan Note (Signed)
Little early to repeat an A1c but she is doing well on the medication so far.  Will go ahead and send a new prescription for 7 mg.F/U in 6 weeks. Will check to see if we have samples to provide.    Visit #: 2 Starting Weight: 233 lbs   Current weight: 231 lbs  Previous weight: 233 lbs  Change in weight:Down 2 lbs  Goal weight:  Dietary goals:eat protein first and then veggies and reduce carbs and sugars.  Stay hydrated. Drink mostly water.  Exercise goals: has a very sickly active job.  Stay active. Medication: Rybelsus 7 mg. Follow-up and referrals: 6 weeks.

## 2022-06-20 NOTE — Progress Notes (Addendum)
Established Patient Office Visit  Subjective   Patient ID: Alicia Steele, female    DOB: 1965/12/20  Age: 57 y.o. MRN: [id number]  Chief Complaint  Patient presents with   Weight Management Screening    HPI  Impaired fasting glucose-no increased thirst or urination. No symptoms consistent with hypoglycemia. She didn't tolerate the higher dose oaf the 1.2 Victoza. Caused nausea and bloating, and severe heartburn and even her heartburn pill wasn't working.    She has been switched to Rybelsus. She is taking 2 of the  dose.  She has noticed that since she went up to a total of 6 mg that she has had definitely some early satiety noticing foods do not taste the same.  Things like beer are much more feeling to her stomach.  She says that she is concerned that she has not lost a lot of weight but she is down several pounds on her home scale.  She was 224 and is now down to 215 at home.  He feels like a lot of the weight gain is related to her struggle with plantar fasciitis.  She says it took forever to get better and during that time it made it really difficult for her to exercise and stay active.  Is also had some pain on the lateral left foot over the proximal head of the fifth metatarsal.  She tries to wear good supportive shoes and does not feel like the rubbing.  She also wanted to find out if we could get her scheduled back for alternating mammograms and breast MRIs.  She had her breast surgery done at [facility] and when she tried to schedule her mammogram they called her before her exam and told her that they were not in network and that she would have to have it scheduled elsewhere.   He also reports that sometimes friends comment that she breathes heavily.  She denies feeling short of breath she thinks it is probably just from weight gain.  No abnormal cough or wheezing.  ROS    Objective:     BP 120/72   Pulse (!) 103   Ht  (1.651 m)   Wt 231 lb (104.8 kg)   SpO2 98%    BMI 38.44 kg/m    Physical Exam Vitals reviewed.  Constitutional:      Appearance: She is well-developed.  HENT:     Head: Normocephalic and atraumatic.  Eyes:     Conjunctiva/sclera: Conjunctivae normal.  Cardiovascular:     Rate and Rhythm: Normal rate.  Pulmonary:     Effort: Pulmonary effort is normal.  Musculoskeletal:     Comments: Tender over the proximal head of the fifth metatarsal on the left foot.  Skin:    General: Skin is dry.     Coloration: Skin is not pale.  Neurological:     Mental Status: She is alert and oriented to person, place, and time.  Psychiatric:        Behavior: Behavior normal.      No results found for any visits on 06/20/22.    The ASCVD Risk score (Arnett DK, et al., 2019) failed to calculate for the following reasons:   The valid total cholesterol range is 130 to 320 mg/dL    Assessment & Plan:   Problem List Items Addressed This Visit       Cardiovascular and Mediastinum   HYPERTENSION, BENIGN    Pressure looks great today.  Endocrine   IFG (impaired fasting glucose) - Primary    Little early to repeat an A1c but she is doing well on the medication so far.  Will go ahead and send a new prescription for 7 mg.F/U in 6 weeks. Will check to see if we have samples to provide.    Visit #: 2 Starting Weight: 233 lbs   Current weight: 231 lbs  Previous weight: 233 lbs  Change in weight:Down 2 lbs  Goal weight:  Dietary goals:eat protein first and then veggies and reduce carbs and sugars.  Stay hydrated. Drink mostly water.  Exercise goals: has a very sickly active job.  Stay active. Medication: Rybelsus 7 mg. Follow-up and referrals: 6 weeks.         Other Visit Diagnoses     Pain of foot, unspecified laterality       Relevant Orders   Ambulatory referral to Podiatry       Will contact the surgeons office since she did not get the breast MRI done at [facility] because it was out of network.  They need  to try to get her scheduled somewhere else.  No follow-ups on file.    Nani Gasser, MD

## 2022-06-20 NOTE — Addendum Note (Signed)
Addended by: Nani Gasser D on: 06/20/2022 05:15 PM   Modules accepted: Orders

## 2022-06-25 NOTE — Telephone Encounter (Signed)
Called [facility] [place] [facility] - left detailed  message for [facility] referral coordinator. Requesting a return call .

## 2022-07-03 ENCOUNTER — Encounter: Payer: Self-pay | Admitting: Podiatry

## 2022-07-03 ENCOUNTER — Ambulatory Visit: Payer: 59 | Admitting: Podiatry

## 2022-07-03 ENCOUNTER — Ambulatory Visit (INDEPENDENT_AMBULATORY_CARE_PROVIDER_SITE_OTHER): Payer: 59

## 2022-07-03 DIAGNOSIS — M79671 Pain in right foot: Secondary | ICD-10-CM | POA: Diagnosis not present

## 2022-07-03 DIAGNOSIS — M2011 Hallux valgus (acquired), right foot: Secondary | ICD-10-CM | POA: Diagnosis not present

## 2022-07-03 DIAGNOSIS — M79672 Pain in left foot: Secondary | ICD-10-CM | POA: Diagnosis not present

## 2022-07-03 DIAGNOSIS — M7672 Peroneal tendinitis, left leg: Secondary | ICD-10-CM | POA: Diagnosis not present

## 2022-07-03 DIAGNOSIS — M19072 Primary osteoarthritis, left ankle and foot: Secondary | ICD-10-CM | POA: Diagnosis not present

## 2022-07-03 DIAGNOSIS — M19071 Primary osteoarthritis, right ankle and foot: Secondary | ICD-10-CM | POA: Diagnosis not present

## 2022-07-03 DIAGNOSIS — M7731 Calcaneal spur, right foot: Secondary | ICD-10-CM | POA: Diagnosis not present

## 2022-07-03 DIAGNOSIS — M7732 Calcaneal spur, left foot: Secondary | ICD-10-CM | POA: Diagnosis not present

## 2022-07-03 DIAGNOSIS — M7671 Peroneal tendinitis, right leg: Secondary | ICD-10-CM | POA: Diagnosis not present

## 2022-07-03 MED ORDER — MELOXICAM 15 MG PO TABS: 15.0000 mg | ORAL_TABLET | Freq: Every day | ORAL | 0 refills | Status: DC

## 2022-07-03 NOTE — Progress Notes (Signed)
  Subjective:  Patient ID: Alicia Steele, female    DOB: 09/15/1965,   MRN: [id number]  No chief complaint on file.   57 y.o. female presents for concern of bilateral foot pain that has been going on for a few months. Relates he works actively on her feet and always up and down ladders. Relates she has tried different shoe gear and wearing several socks to pad area. Relates a history of plantar fasciitis and cuboid subluxation.  . Denies any other pedal complaints. Denies n/v/f/c.   Past Medical History:  Diagnosis Date   HYPERTENSION, BENIGN 02/11/2010   Qualifier: Diagnosis of  By: Linford Arnold MD, Catherine     Mood disorder in conditions classified elsewhere 05/20/2012   On celexa for tearfulness. Works well.     PCOS (polycystic ovarian syndrome)    PONV (postoperative nausea and vomiting)     Objective:  Physical Exam: Vascular: DP/PT pulses 2/4 bilateral. CFT <3 seconds. Normal hair growth on digits. No edema.  Skin. No lacerations or abrasions bilateral feet.  Musculoskeletal: MMT 5/5 bilateral lower extremities in DF, PF, Inversion and Eversion. Deceased ROM in DF of ankle joint. Tender to bilateral peroneal brevis tendon insertions at the base of the fifth metatarsal. Pain with inversion and eversion bilateral. Minimal pain with DF and PF bilateral.  Neurological: Sensation intact to light touch.   Assessment:   1. Peroneal tendonitis, left   2. Peroneal tendonitis, right      Plan:  Patient was evaluated and treated and all questions answered. X-rays reviewed and discussed with patient. Cortical irregularity noted to the base of bilateral fifth metatarsals consistent either with old injury or fracture.  Reviewed notes from PCP. Discussed peroneal tendinitis and treatment options at length with patient Discussed stretching exercises and provided handout. Prescription for meloxicam provided Dispensed Tri-Lock ankle brace. Discussed that if the symptoms do not improve can  consider PT/MRI. Patient to return in 6 to 8 weeks or sooner if symptoms fail to improve or worsen.   Louann Sjogren, DPM

## 2022-07-03 NOTE — Patient Instructions (Signed)
Peroneal Tendinopathy Rehab Ask your health care provider which exercises are safe for you. Do exercises exactly as told by your health care provider and adjust them as directed. It is normal to feel mild stretching, pulling, tightness, or discomfort as you do these exercises. Stop right away if you feel sudden pain or your pain gets worse. Do not begin these exercises until told by your health care provider. Stretching and range-of-motion exercises These exercises warm up your muscles and joints. They can help improve the movement and flexibility of your ankle. They may also help to relieve pain and stiffness. Gastrocnemius and soleus stretch, standing This is an exercise in which you stand on a step and use your body weight to stretch your calf muscles. To do this exercise: Stand on the edge of a step on the ball of your left / right foot. The ball of your foot is on the walking surface, right under your toes. Keep your other foot firmly on the same step. Hold on to the wall, a railing, or a chair for balance. Slowly lift your other foot, allowing your body weight to press your left / right heel down over the edge of the step. You should feel a stretch in your left / right calf (gastrocnemius and soleus). Hold this position for __________ seconds. Return both feet to the step. Repeat this exercise with a slight bend in your left / right knee. Repeat __________ times with your left / right knee straight and __________ times with your left / right knee bent. Complete this exercise __________ times a day. Strengthening exercises These exercises build strength and endurance in your foot and ankle. Endurance is the ability to use your muscles for a long time, even after they get tired. Ankle dorsiflexion with band  Secure a rubber exercise band or tube to an object, such as a table leg, that will not move when the band is pulled. Secure the other end of the band around your left / right foot. Sit on  the floor. Face the object with your left / right leg extended. The band or tube should be slightly tense when your foot is relaxed. Slowly flex your left / right ankle and toes to bring your foot toward you (dorsiflexion). Hold this position for __________ seconds. Let the band or tube slowly pull your foot back to the starting position. Repeat __________ times. Complete this exercise __________ times a day. Ankle eversion  Sit on the floor with your legs straight out in front of you. Loop a rubber exercise band or tube around the ball of your left / right foot. The ball of your foot is on the walking surface, right under your toes. Hold the ends of the band in your hands. You can also secure the band to a stable object. The band or tube should be slightly tense when your foot is relaxed. Slowly push your foot outward, away from your other leg (eversion). Hold this position for __________ seconds. Slowly return your foot to the starting position. Repeat __________ times. Complete this exercise __________ times a day. Plantar flexion, standing This exercise is sometimes called a standing heel raise. Stand with your feet shoulder-width apart. Place your hands on a wall or table to steady yourself as needed. Try not to use it for support. Keep your weight spread evenly over the width of your feet while you slowly rise up on your toes (plantar flexion). If told by your health care provider: Shift your weight   toward your left / right leg until you feel challenged. Stand on your left / right leg only. Hold this position for __________ seconds. Repeat __________ times. Complete this exercise __________ times a day. Single leg stand  Without shoes, stand near a railing or in a doorway. You may hold on to the railing or doorframe as needed. Stand on your left / right foot. Keep your big toe down on the floor and try to keep your arch lifted. Do not roll to the outside of your foot. If this  exercise is too easy, you can try it with your eyes closed or while standing on a pillow. Hold this position for __________ seconds. Repeat __________ times. Complete this exercise __________ times a day. This information is not intended to replace advice given to you by your health care provider. Make sure you discuss any questions you have with your health care provider. Document Revised: 06/20/2021 Document Reviewed: 06/20/2021 Elsevier Patient Education  2023 Elsevier Inc.  

## 2022-07-29 ENCOUNTER — Encounter: Payer: Self-pay | Admitting: Family Medicine

## 2022-07-29 ENCOUNTER — Ambulatory Visit: Payer: 59 | Admitting: Family Medicine

## 2022-07-29 VITALS — BP 133/74 | HR 85 | Ht 65.0 in | Wt 236.0 lb

## 2022-07-29 DIAGNOSIS — I10 Essential (primary) hypertension: Secondary | ICD-10-CM

## 2022-07-29 DIAGNOSIS — R7301 Impaired fasting glucose: Secondary | ICD-10-CM | POA: Diagnosis not present

## 2022-07-29 DIAGNOSIS — Z7689 Persons encountering health services in other specified circumstances: Secondary | ICD-10-CM | POA: Insufficient documentation

## 2022-07-29 LAB — POCT GLYCOSYLATED HEMOGLOBIN (HGB A1C): Hemoglobin A1C: 5.3 % (ref 4.0–5.6)

## 2022-07-29 MED ORDER — VICTOZA 18 MG/3ML ~~LOC~~ SOPN
1.2000 mg | PEN_INJECTOR | Freq: Every day | SUBCUTANEOUS | 0 refills | Status: DC
Start: 2022-07-29 — End: 2022-09-09

## 2022-07-29 NOTE — Assessment & Plan Note (Addendum)
.  Bp borderline today.  Due for updated labs.

## 2022-07-29 NOTE — Addendum Note (Signed)
Addended by: Nani Gasser D on: 07/29/2022 05:01 PM   Modules accepted: Level of Service

## 2022-07-29 NOTE — Progress Notes (Addendum)
Established Patient Office Visit  Subjective   Patient ID: ISSIS HASSING, female    DOB: 03-17-1965  Age: 56 y.o. MRN: [id number]  Chief Complaint  Patient presents with   Weight Check    HPI  IFG - feels has been eating the and doing well with that.  She stays very physically active for her job.  She had difficulty getting the Rybelsus the pharmacy said they did not have it so when she ran out of the samples she actually ended up going back to the Victoza.  She ended up splitting the dose 1 in the morning and in the evening to get the total of 1.2 mg and was actually able to tolerate it well so so far she has been on it for several days without any significant nausea or side effects.  She has not had a rash either.  She eventually figured out what was causing the rash and it was not the medication.  Has been on meloxicam for her feet. It has been helping. Still gets pain with climbing ladder.     ROS    Objective:     BP 133/74   Pulse 85   Ht 5\' 5"  (1.651 m)   Wt 236 lb (107 kg)   SpO2 96%   BMI 39.27 kg/m    Physical Exam Vitals and nursing note reviewed.  Constitutional:      Appearance: She is well-developed.  HENT:     Head: Normocephalic and atraumatic.  Cardiovascular:     Rate and Rhythm: Normal rate and regular rhythm.     Heart sounds: Normal heart sounds.  Pulmonary:     Effort: Pulmonary effort is normal.     Breath sounds: Normal breath sounds.  Skin:    General: Skin is warm and dry.  Neurological:     Mental Status: She is alert and oriented to person, place, and time.  Psychiatric:        Behavior: Behavior normal.      Results for orders placed or performed in visit on 07/29/22  POCT glycosylated hemoglobin (Hb A1C)  Result Value Ref Range   Hemoglobin A1C 5.3 4.0 - 5.6 %   HbA1c POC (<> result, manual entry)     HbA1c, POC (prediabetic range)     HbA1c, POC (controlled diabetic range)        The ASCVD Risk score (Arnett DK, et  al., 2019) failed to calculate for the following reasons:   The valid total cholesterol range is 130 to 320 mg/dL    Assessment & Plan:   Problem List Items Addressed This Visit       Cardiovascular and Mediastinum   HYPERTENSION, BENIGN    .Bp borderline today.  Due for updated labs.      Relevant Orders   COMPLETE METABOLIC PANEL WITH GFR     Endocrine   IFG (impaired fasting glucose) - Primary    Will go back to Victoza. Currently on 1.2 mg and doing well.    Visit #: 3 Starting Weight: 233 lbs    Current weight: 236 lbs  Previous weight: 231 lbs  Change in weight:up 5 lbs  Goal weight: 200 lbs  Dietary goals:eat protein first and then veggies and reduce carbs and sugars.  Stay hydrated. Drink mostly water.  Exercise goals: has a very sickly active job.  Stay active. Medication: Victoza 1.2 mg.   Follow-up and referrals: 6 weeks.       Relevant  Medications   liraglutide (VICTOZA) 18 MG/3ML SOPN   Other Relevant Orders   POCT glycosylated hemoglobin (Hb A1C) (Completed)    Return in about 6 weeks (around 09/09/2022) for weight management.    Nani Gasser, MD

## 2022-07-29 NOTE — Assessment & Plan Note (Signed)
Will go back to Victoza. Currently on 1.2 mg and doing well.    Visit #: 3 Starting Weight: 233 lbs    Current weight: 236 lbs  Previous weight: 231 lbs  Change in weight:up 5 lbs  Goal weight: 200 lbs  Dietary goals:eat protein first and then veggies and reduce carbs and sugars.  Stay hydrated. Drink mostly water.  Exercise goals: has a very sickly active job.  Stay active. Medication: Victoza 1.2 mg.   Follow-up and referrals: 6 weeks.

## 2022-07-30 LAB — COMPLETE METABOLIC PANEL WITH GFR
AG Ratio: 1.6 (calc) (ref 1.0–2.5)
ALT: 26 U/L (ref 6–29)
AST: 28 U/L (ref 10–35)
Albumin: 4.6 g/dL (ref 3.6–5.1)
Alkaline phosphatase (APISO): 89 U/L (ref 37–153)
BUN: 17 mg/dL (ref 7–25)
CO2: 30 mmol/L (ref 20–32)
Calcium: 10.2 mg/dL (ref 8.6–10.4)
Chloride: 97 mmol/L — ABNORMAL LOW (ref 98–110)
Creat: 0.76 mg/dL (ref 0.50–1.03)
Globulin: 2.9 g/dL (calc) (ref 1.9–3.7)
Glucose, Bld: 96 mg/dL (ref 65–99)
Potassium: 4.3 mmol/L (ref 3.5–5.3)
Sodium: 136 mmol/L (ref 135–146)
Total Bilirubin: 0.4 mg/dL (ref 0.2–1.2)
Total Protein: 7.5 g/dL (ref 6.1–8.1)
eGFR: 92 mL/min/{1.73_m2} (ref >=60)

## 2022-07-30 NOTE — Progress Notes (Signed)
Great news!  The liver enzyme is back down to normal which is great.  Metabolic panel is stable.

## 2022-08-14 ENCOUNTER — Encounter: Payer: Self-pay | Admitting: Podiatry

## 2022-08-14 ENCOUNTER — Ambulatory Visit: Payer: 59 | Admitting: Podiatry

## 2022-08-14 DIAGNOSIS — M7671 Peroneal tendinitis, right leg: Secondary | ICD-10-CM | POA: Diagnosis not present

## 2022-08-14 DIAGNOSIS — M7672 Peroneal tendinitis, left leg: Secondary | ICD-10-CM | POA: Diagnosis not present

## 2022-08-14 NOTE — Progress Notes (Signed)
  Subjective:  Patient ID: Alicia Steele, female    DOB: [birth year],   MRN: [id number]  No chief complaint on file.   57 y.o. female presents for follow-up of bilateral peroneal tendonitis. Relates doing about 70% better. Relates brace and medicine helped and has been stretching as well. Relates a history of plantar fasciitis and cuboid subluxation.  . Denies any other pedal complaints. Denies n/v/f/c.   Past Medical History:  Diagnosis Date   HYPERTENSION, BENIGN 02/11/2010   Qualifier: Diagnosis of  By: Linford Arnold MD, Catherine     Mood disorder in conditions classified elsewhere 05/20/2012   On celexa for tearfulness. Works well.     PCOS (polycystic ovarian syndrome)    PONV (postoperative nausea and vomiting)     Objective:  Physical Exam: Vascular: DP/PT pulses 2/4 bilateral. CFT <3 seconds. Normal hair growth on digits. No edema.  Skin. No lacerations or abrasions bilateral feet.  Musculoskeletal: MMT 5/5 bilateral lower extremities in DF, PF, Inversion and Eversion. Deceased ROM in DF of ankle joint. Minimally tender to bilateral peroneal brevis tendon insertions at the base of the fifth metatarsal. Pain with inversion and eversion bilateral. Minimal pain with DF and PF bilateral.  Neurological: Sensation intact to light touch.   Assessment:   1. Peroneal tendonitis, left   2. Peroneal tendonitis, right       Plan:  Patient was evaluated and treated and all questions answered. X-rays reviewed and discussed with patient. Cortical irregularity noted to the base of bilateral fifth metatarsals consistent either with old injury or fracture.  Reviewed notes from PCP. Discussed peroneal tendinitis and treatment options at length with patient Continue stretching and brace.  Meloxicam refilled.  Discussed that if the symptoms do not improve can consider PT/MRI. Return as needed.    Louann Sjogren, DPM

## 2022-08-14 NOTE — Patient Instructions (Signed)

## 2022-09-09 ENCOUNTER — Encounter: Payer: Self-pay | Admitting: Family Medicine

## 2022-09-09 ENCOUNTER — Other Ambulatory Visit: Payer: Self-pay | Admitting: Family Medicine

## 2022-09-09 ENCOUNTER — Ambulatory Visit (INDEPENDENT_AMBULATORY_CARE_PROVIDER_SITE_OTHER): Payer: 59 | Admitting: Family Medicine

## 2022-09-09 VITALS — BP 133/79 | HR 97 | Ht 65.0 in | Wt 231.0 lb

## 2022-09-09 DIAGNOSIS — I10 Essential (primary) hypertension: Secondary | ICD-10-CM

## 2022-09-09 DIAGNOSIS — R7301 Impaired fasting glucose: Secondary | ICD-10-CM | POA: Diagnosis not present

## 2022-09-09 MED ORDER — VICTOZA 18 MG/3ML ~~LOC~~ SOPN
2.4000 mg | PEN_INJECTOR | Freq: Every day | SUBCUTANEOUS | 1 refills | Status: DC
Start: 2022-09-09 — End: 2022-11-12

## 2022-09-09 NOTE — Progress Notes (Signed)
   Established Patient Office Visit  Subjective   Patient ID: Alicia Steele, female    DOB: 15-Oct-1965  Age: 57 y.o. MRN: [id number]  Chief Complaint  Patient presents with   Weight Check          HPI  Impaired fasting glucose-no increased thirst or urination. No symptoms consistent with hypoglycemia. Follow-up weight management-she is doing really well the Victoza she is actually up to 1.8 mg and has been taking that for about 3 weeks she is getting ready to go pick up a new prescription on Friday.  She really has not had any major side effects when she for started medication she had a little bit of nausea but not really any since then.  She eats healthy in general but has been try to portion control low bit more she is definitely cut back a little bit on bread.  She has a very physically active job so does not necessarily exercise outside of that.    ROS    Objective:     BP 133/79   Pulse 97   Ht 5\' 5"  (1.651 m)   Wt 231 lb (104.8 kg)   SpO2 100%   BMI 38.44 kg/m    Physical Exam Vitals and nursing note reviewed.  Constitutional:      Appearance: She is well-developed.  HENT:     Head: Normocephalic and atraumatic.  Cardiovascular:     Rate and Rhythm: Normal rate and regular rhythm.     Heart sounds: Normal heart sounds.  Pulmonary:     Effort: Pulmonary effort is normal.     Breath sounds: Normal breath sounds.  Skin:    General: Skin is warm and dry.  Neurological:     Mental Status: She is alert and oriented to person, place, and time.  Psychiatric:        Behavior: Behavior normal.      No results found for any visits on 09/09/22.    The ASCVD Risk score (Arnett DK, et al., 2019) failed to calculate for the following reasons:   The valid total cholesterol range is 130 to 320 mg/dL    Assessment & Plan:   Problem List Items Addressed This Visit       Endocrine   IFG (impaired fasting glucose) - Primary    Visit #: 4 Starting Weight: 233  lbs    Current weight: 231 lbs  Previous weight: 236 lbs  Change in weight: Down 5 lbs  Goal weight: 200 lbs  Dietary goals:eat protein first and then veggies and reduce carbs and sugars.  Stay hydrated. Drink mostly water.  Exercise goals: has a very physically  active job.  Stay active. Medication: Victoza 2.4  mg.   Follow-up and referrals: 8  weeks      Relevant Medications   liraglutide (VICTOZA) 18 MG/3ML SOPN    Return in about 2 months (around 11/10/2022) for A1C, Medicatoin, Tdap shot.  .   I spent 20 minutes on the day of the encounter to include pre-visit record review, face-to-face time with the patient and post visit ordering of test.  Nani Gasser, MD

## 2022-09-09 NOTE — Assessment & Plan Note (Signed)
Visit #: 4 Starting Weight: 233 lbs    Current weight: 231 lbs  Previous weight: 236 lbs  Change in weight: Down 5 lbs  Goal weight: 200 lbs  Dietary goals:eat protein first and then veggies and reduce carbs and sugars.  Stay hydrated. Drink mostly water.  Exercise goals: has a very physically  active job.  Stay active. Medication: Victoza 2.4  mg.   Follow-up and referrals: 8  weeks

## 2022-09-10 ENCOUNTER — Other Ambulatory Visit: Payer: Self-pay | Admitting: Podiatry

## 2022-11-06 ENCOUNTER — Telehealth: Payer: Self-pay | Admitting: Family Medicine

## 2022-11-06 ENCOUNTER — Ambulatory Visit: Payer: 59 | Admitting: Family Medicine

## 2022-11-06 VITALS — BP 128/71 | HR 96 | Ht 65.0 in | Wt 228.0 lb

## 2022-11-06 DIAGNOSIS — I10 Essential (primary) hypertension: Secondary | ICD-10-CM

## 2022-11-06 DIAGNOSIS — G4710 Hypersomnia, unspecified: Secondary | ICD-10-CM

## 2022-11-06 DIAGNOSIS — R5383 Other fatigue: Secondary | ICD-10-CM

## 2022-11-06 DIAGNOSIS — R7301 Impaired fasting glucose: Secondary | ICD-10-CM | POA: Diagnosis not present

## 2022-11-06 LAB — POCT GLYCOSYLATED HEMOGLOBIN (HGB A1C): Hemoglobin A1C: 5.5 % (ref 4.0–5.6)

## 2022-11-06 NOTE — Telephone Encounter (Signed)
Call patient: Per our records it looks like she is due for an updated mammogram would like to get that scheduled for her if possible I think she was actually a recall for something that they were following.  Did she go somewhere else to have that done?

## 2022-11-06 NOTE — Assessment & Plan Note (Addendum)
Well controlled. Continue current regimen. Follow up in  61mo reports that she has not had to use her furosemide at all since starting the Trulicity.

## 2022-11-06 NOTE — Assessment & Plan Note (Signed)
Visit #: 5 Starting Weight: 233 lbs    Current weight: 228 lbs  lbs  Previous weight: 231 lbs  Change in weight: Down 3 lbs  Goal weight: 200 lbs  Dietary goals:eat protein first and then veggies and reduce carbs and sugars.  Stay hydrated. Drink mostly water.  Exercise goals: has a very physically  active job.  Stay active. Medication: Victoza 2.4 mg.   Follow-up and referrals: 8  weeks

## 2022-11-06 NOTE — Progress Notes (Signed)
Established Patient Office Visit  Subjective   Patient ID: Alicia Steele, female    DOB: August 17, 1965  Age: 57 y.o. MRN: [id number]  Chief Complaint  Patient presents with   ifg    HPI  Impaired fasting glucose-no increased thirst or urination. No symptoms consistent with hypoglycemia.  Currently on Victoza and doing well and not having any significant problems.  She has noticed that she has had little bit more constipation recently but is try to be proactive with that she is picked up some stool softeners and is planning on getting some prunes.  She was previously eating peaches and that was helping.  She has lost 3 pounds since I last saw her.  She has noticed that she is extremely tired on the weekends to the point where she could just about sleep all day.  During the week she is very physically active so does not necessarily have a separate exercise routine.     ROS    Objective:     BP 128/71   Pulse 96   Ht 5\' 5"  (1.651 m)   Wt 228 lb (103.4 kg)   SpO2 94%   BMI 37.94 kg/m    Physical Exam Vitals and nursing note reviewed.  Constitutional:      Appearance: Normal appearance.  HENT:     Head: Normocephalic and atraumatic.  Eyes:     Conjunctiva/sclera: Conjunctivae normal.  Cardiovascular:     Rate and Rhythm: Normal rate and regular rhythm.  Pulmonary:     Effort: Pulmonary effort is normal.     Breath sounds: Normal breath sounds.  Skin:    General: Skin is warm and dry.  Neurological:     Mental Status: She is alert.  Psychiatric:        Mood and Affect: Mood normal.      Results for orders placed or performed in visit on 11/06/22  POCT HgB A1C  Result Value Ref Range   Hemoglobin A1C 5.5 4.0 - 5.6 %   HbA1c POC (<> result, manual entry)     HbA1c, POC (prediabetic range)     HbA1c, POC (controlled diabetic range)        The ASCVD Risk score (Arnett DK, et al., 2019) failed to calculate for the following reasons:   The valid total  cholesterol range is 130 to 320 mg/dL    Assessment & Plan:   Problem List Items Addressed This Visit       Cardiovascular and Mediastinum   HYPERTENSION, BENIGN    Well controlled. Continue current regimen. Follow up in  40mo reports that she has not had to use her furosemide at all since starting the Trulicity.      Relevant Orders   TSH + free T4   CMP14+EGFR   Fe+TIBC+Fer   Vitamin B1   B12   CBC with Differential/Platelet     Endocrine   IFG (impaired fasting glucose) - Primary    Visit #: 5 Starting Weight: 233 lbs    Current weight: 228 lbs  lbs  Previous weight: 231 lbs  Change in weight: Down 3 lbs  Goal weight: 200 lbs  Dietary goals:eat protein first and then veggies and reduce carbs and sugars.  Stay hydrated. Drink mostly water.  Exercise goals: has a very physically  active job.  Stay active. Medication: Victoza 2.4 mg.   Follow-up and referrals: 8  weeks      Relevant Orders   POCT HgB A1C (  Completed)   TSH + free T4   CMP14+EGFR   Fe+TIBC+Fer   Vitamin B1   B12   CBC with Differential/Platelet   Other Visit Diagnoses     Other fatigue       Relevant Orders   TSH + free T4   CMP14+EGFR   Fe+TIBC+Fer   Vitamin B1   B12   CBC with Differential/Platelet   Excessive sleepiness       Relevant Orders   TSH + free T4   CMP14+EGFR   Fe+TIBC+Fer   Vitamin B1   B12   CBC with Differential/Platelet      Fatigue, severe -will do some additional labs just to rule out thyroid disorder, mineral deficiencies.  Fall labs are normal then consider alternative diagnoses like sleep apnea etc.  It also looks like she is due for mammogram.  Will try to see if we can get that updated and scheduled.  Lab Results  Component Value Date   HGBA1C 5.5 11/06/2022      Return in about 3 months (around 02/06/2023) for Pre-diabetes.    Nani Gasser, MD

## 2022-11-07 NOTE — Progress Notes (Signed)
Riha, chloride is just borderline low and your calcium is just borderline elevated but not in a worrisome range.  If you are taking some extra calcium or calcium products you can decrease the frequency.  The ALT liver enzyme is elevated again similar to last year.  Again not in a worrisome range but I do want to keep an eye on it and plan to recheck again in 4 to 6 months.  Your thyroid looks great.  Iron also looks great.  Your blood count is normal and B12 is normal.  B1 is still pending.  So right now there is nothing specific to explain your fatigue on the weekends.  Do you snore?  Any signs of sleep apnea?

## 2022-11-07 NOTE — Telephone Encounter (Signed)
Spoke with patient. She was suppose to follow up for MRI and MMG with surgeon who did her procedure.  She had this scheduled at one time but was told insurance did not approve it. She has been so busy at work she has not followed up . She will make this a priority as work will slow down in the next two weeks.

## 2022-11-08 ENCOUNTER — Other Ambulatory Visit: Payer: Self-pay | Admitting: Family Medicine

## 2022-11-08 DIAGNOSIS — R7301 Impaired fasting glucose: Secondary | ICD-10-CM

## 2022-11-11 ENCOUNTER — Other Ambulatory Visit: Payer: Self-pay | Admitting: Family Medicine

## 2022-11-11 DIAGNOSIS — F0630 Mood disorder due to known physiological condition, unspecified: Secondary | ICD-10-CM

## 2022-11-11 LAB — CBC WITH DIFFERENTIAL/PLATELET
Basophils Absolute: 0.1 10*3/uL (ref 0.0–0.2)
Basos: 1 %
EOS (ABSOLUTE): 0.2 10*3/uL (ref 0.0–0.4)
Eos: 2 %
Hematocrit: 46.2 % (ref 34.0–46.6)
Hemoglobin: 15.7 g/dL (ref 11.1–15.9)
Immature Grans (Abs): 0 10*3/uL (ref 0.0–0.1)
Immature Granulocytes: 0 %
Lymphocytes Absolute: 2.3 10*3/uL (ref 0.7–3.1)
Lymphs: 24 %
MCH: 30.1 pg (ref 26.6–33.0)
MCHC: 34 g/dL (ref 31.5–35.7)
MCV: 89 fL (ref 79–97)
Monocytes Absolute: 0.8 10*3/uL (ref 0.1–0.9)
Monocytes: 9 %
Neutrophils Absolute: 6 10*3/uL (ref 1.4–7.0)
Neutrophils: 64 %
Platelets: 430 10*3/uL (ref 150–450)
RBC: 5.21 x10E6/uL (ref 3.77–5.28)
RDW: 12.8 % (ref 11.7–15.4)
WBC: 9.5 10*3/uL (ref 3.4–10.8)

## 2022-11-11 LAB — CMP14+EGFR
ALT: 34 IU/L — ABNORMAL HIGH (ref 0–32)
AST: 37 IU/L (ref 0–40)
Albumin: 4.7 g/dL (ref 3.8–4.9)
Alkaline Phosphatase: 109 IU/L (ref 44–121)
BUN/Creatinine Ratio: 14 (ref 9–23)
BUN: 12 mg/dL (ref 6–24)
Bilirubin Total: 0.5 mg/dL (ref 0.0–1.2)
CO2: 25 mmol/L (ref 20–29)
Calcium: 10.3 mg/dL — ABNORMAL HIGH (ref 8.7–10.2)
Chloride: 94 mmol/L — ABNORMAL LOW (ref 96–106)
Creatinine, Ser: 0.84 mg/dL (ref 0.57–1.00)
Globulin, Total: 3.1 g/dL (ref 1.5–4.5)
Glucose: 94 mg/dL (ref 70–99)
Potassium: 4.1 mmol/L (ref 3.5–5.2)
Sodium: 134 mmol/L (ref 134–144)
Total Protein: 7.8 g/dL (ref 6.0–8.5)
eGFR: 82 mL/min/{1.73_m2} (ref >59)

## 2022-11-11 LAB — TSH+FREE T4
Free T4: 1.18 ng/dL (ref 0.82–1.77)
TSH: 2.17 u[IU]/mL (ref 0.450–4.500)

## 2022-11-11 LAB — VITAMIN B12: Vitamin B-12: 462 pg/mL (ref 232–1245)

## 2022-11-11 LAB — IRON,TIBC AND FERRITIN PANEL
Ferritin: 117 ng/mL (ref 15–150)
Iron Saturation: 25 % (ref 15–55)
Iron: 107 ug/dL (ref 27–159)
Total Iron Binding Capacity: 433 ug/dL (ref 250–450)
UIBC: 326 ug/dL (ref 131–425)

## 2022-11-11 LAB — VITAMIN B1: Thiamine: 118.3 nmol/L (ref 66.5–200.0)

## 2022-11-11 NOTE — Progress Notes (Signed)
Vitamin B1 looks great.

## 2023-01-10 ENCOUNTER — Other Ambulatory Visit: Payer: Self-pay | Admitting: Family Medicine

## 2023-01-10 DIAGNOSIS — R7301 Impaired fasting glucose: Secondary | ICD-10-CM

## 2023-02-10 ENCOUNTER — Ambulatory Visit: Payer: 59 | Admitting: Family Medicine

## 2023-02-13 ENCOUNTER — Other Ambulatory Visit: Payer: Self-pay | Admitting: Family Medicine

## 2023-02-13 DIAGNOSIS — F0630 Mood disorder due to known physiological condition, unspecified: Secondary | ICD-10-CM

## 2023-02-25 ENCOUNTER — Ambulatory Visit: Payer: 59 | Admitting: Family Medicine

## 2023-03-08 ENCOUNTER — Other Ambulatory Visit: Payer: Self-pay | Admitting: Family Medicine

## 2023-03-08 DIAGNOSIS — I10 Essential (primary) hypertension: Secondary | ICD-10-CM

## 2023-03-12 ENCOUNTER — Other Ambulatory Visit: Payer: Self-pay | Admitting: Family Medicine

## 2023-03-12 DIAGNOSIS — R7301 Impaired fasting glucose: Secondary | ICD-10-CM

## 2023-03-24 ENCOUNTER — Other Ambulatory Visit: Payer: Self-pay | Admitting: Family Medicine

## 2023-03-24 DIAGNOSIS — F0630 Mood disorder due to known physiological condition, unspecified: Secondary | ICD-10-CM

## 2023-05-15 ENCOUNTER — Other Ambulatory Visit: Payer: Self-pay | Admitting: Family Medicine

## 2023-05-15 DIAGNOSIS — R7301 Impaired fasting glucose: Secondary | ICD-10-CM

## 2023-05-18 NOTE — Telephone Encounter (Signed)
 Patient scheduled for 06/23/23, patient also states that her insurance denied her liraglutide (VICTOZA) 18 MG/3ML SOPN [[id number]] medication, and would like to know if she could get samples before her appt 06/23/23, thanks.

## 2023-05-18 NOTE — Telephone Encounter (Signed)
 Please call pt she is due for an OV. She will need to schedule an appt and have labs done to get refills.

## 2023-05-20 NOTE — Telephone Encounter (Signed)
 We don't have samples for Victoza. Please advise pt.

## 2023-05-21 NOTE — Telephone Encounter (Signed)
 Called patient to inform her of no samples of Victoza, no answer, LVM. Thanks.

## 2023-06-01 ENCOUNTER — Other Ambulatory Visit: Payer: Self-pay | Admitting: Family Medicine

## 2023-06-01 DIAGNOSIS — F0630 Mood disorder due to known physiological condition, unspecified: Secondary | ICD-10-CM

## 2023-06-11 ENCOUNTER — Other Ambulatory Visit: Payer: Self-pay | Admitting: Family Medicine

## 2023-06-11 DIAGNOSIS — I10 Essential (primary) hypertension: Secondary | ICD-10-CM

## 2023-06-23 ENCOUNTER — Encounter: Payer: Self-pay | Admitting: Family Medicine

## 2023-06-23 ENCOUNTER — Ambulatory Visit: Admitting: Family Medicine

## 2023-06-23 VITALS — BP 135/84 | HR 95 | Ht 65.0 in | Wt 229.0 lb

## 2023-06-23 DIAGNOSIS — E782 Mixed hyperlipidemia: Secondary | ICD-10-CM | POA: Diagnosis not present

## 2023-06-23 DIAGNOSIS — R7301 Impaired fasting glucose: Secondary | ICD-10-CM

## 2023-06-23 DIAGNOSIS — E66812 Obesity, class 2: Secondary | ICD-10-CM

## 2023-06-23 DIAGNOSIS — Z6838 Body mass index (BMI) 38.0-38.9, adult: Secondary | ICD-10-CM | POA: Diagnosis not present

## 2023-06-23 DIAGNOSIS — I10 Essential (primary) hypertension: Secondary | ICD-10-CM | POA: Diagnosis not present

## 2023-06-23 DIAGNOSIS — R7401 Elevation of levels of liver transaminase levels: Secondary | ICD-10-CM

## 2023-06-23 LAB — POCT GLYCOSYLATED HEMOGLOBIN (HGB A1C): Hemoglobin A1C: 5.3 % (ref 4.0–5.6)

## 2023-06-23 MED ORDER — TIRZEPATIDE-WEIGHT MANAGEMENT 2.5 MG/0.5ML ~~LOC~~ SOLN: 2.5000 mg | SUBCUTANEOUS | 0 refills | Status: DC

## 2023-06-23 NOTE — Progress Notes (Unsigned)
 Established Patient Office Visit  Subjective  Patient ID: Alicia Steele, female    DOB: 07/24/1965  Age: 58 y.o. MRN: [id number]  Chief Complaint  Patient presents with   ifg    HPI   Hypertension- Pt denies chest pain, SOB, dizziness, or heart palpitations.  Taking meds as directed w/o problems.  Denies medication side effects.    Impaired fasting glucose-no increased thirst or urination. No symptoms consistent with hypoglycemia.Highest A1C ws 6.1 about a yr ago.  She tried metformin and didn't do well with that.  She has been on Victoza and was hoping to lose more weight but she didn't.  She would like to try tirzepatide if possible.  Has been a little extra stress as a longtime coworker was actually in [place] money from her.  It has been really upsetting and distressing.  {History (Optional):23778}  ROS    Objective:     BP 135/84   Pulse 95   Ht 5\' 5"  (1.651 m)   Wt 229 lb (103.9 kg)   SpO2 98%   BMI 38.11 kg/m  {Vitals History (Optional):23777}  Physical Exam Vitals and nursing note reviewed.  Constitutional:      Appearance: Normal appearance.  HENT:     Head: Normocephalic and atraumatic.  Eyes:     Conjunctiva/sclera: Conjunctivae normal.  Cardiovascular:     Rate and Rhythm: Normal rate and regular rhythm.  Pulmonary:     Effort: Pulmonary effort is normal.     Breath sounds: Normal breath sounds.  Skin:    General: Skin is warm and dry.  Neurological:     Mental Status: She is alert.  Psychiatric:        Mood and Affect: Mood normal.      Results for orders placed or performed in visit on 06/23/23  POCT HgB A1C  Result Value Ref Range   Hemoglobin A1C 5.3 4.0 - 5.6 %   HbA1c POC (<> result, manual entry)     HbA1c, POC (prediabetic range)     HbA1c, POC (controlled diabetic range)      {Labs (Optional):23779}  The ASCVD Risk score (Arnett DK, et al., 2019) failed to calculate for the following reasons:   The valid total cholesterol  range is 130 to 320 mg/dL    Assessment & Plan:   Problem List Items Addressed This Visit       Cardiovascular and Mediastinum   HYPERTENSION, BENIGN - Primary   BP ok today. Will get up date labs.        Relevant Medications   tirzepatide (ZEPBOUND) 2.5 MG/0.5ML injection vial   Other Relevant Orders   CMP14+EGFR   Lipid panel   CBC with Differential/Platelet     Endocrine   IFG (impaired fasting glucose)   Relevant Medications   tirzepatide (ZEPBOUND) 2.5 MG/0.5ML injection vial   Other Relevant Orders   CMP14+EGFR   Lipid panel   POCT HgB A1C (Completed)   CBC with Differential/Platelet     Other   Hyperlipemia   Due to recheck lipid panel not currently on a statin.      Relevant Orders   CMP14+EGFR   Lipid panel   CBC with Differential/Platelet   Elevated ALT measurement   Her labs last summer ALT was a little elevated so we will plan to recheck that today.      Relevant Medications   tirzepatide (ZEPBOUND) 2.5 MG/0.5ML injection vial   Other Relevant Orders   CMP14+EGFR  Lipid panel   CBC with Differential/Platelet   Other Visit Diagnoses       Class 2 severe obesity due to excess calories with serious comorbidity and body mass index (BMI) of 38.0 to 38.9 in adult [facility])       Relevant Medications   tirzepatide (ZEPBOUND) 2.5 MG/0.5ML injection vial       No follow-ups on file.    Duaine German, MD

## 2023-06-23 NOTE — Assessment & Plan Note (Signed)
 BP ok today. Will get up date labs.

## 2023-06-23 NOTE — Assessment & Plan Note (Signed)
 Her labs last summer ALT was a little elevated so we will plan to recheck that today.

## 2023-06-23 NOTE — Assessment & Plan Note (Signed)
 Due to recheck lipid panel not currently on a statin.

## 2023-06-24 ENCOUNTER — Other Ambulatory Visit: Payer: Self-pay | Admitting: *Deleted

## 2023-06-24 ENCOUNTER — Telehealth: Payer: Self-pay

## 2023-06-24 ENCOUNTER — Other Ambulatory Visit (HOSPITAL_COMMUNITY): Payer: Self-pay

## 2023-06-24 ENCOUNTER — Encounter: Payer: Self-pay | Admitting: Family Medicine

## 2023-06-24 DIAGNOSIS — R7401 Elevation of levels of liver transaminase levels: Secondary | ICD-10-CM

## 2023-06-24 DIAGNOSIS — R7301 Impaired fasting glucose: Secondary | ICD-10-CM

## 2023-06-24 DIAGNOSIS — E66812 Obesity, class 2: Secondary | ICD-10-CM | POA: Insufficient documentation

## 2023-06-24 DIAGNOSIS — I10 Essential (primary) hypertension: Secondary | ICD-10-CM

## 2023-06-24 LAB — CMP14+EGFR
ALT: 33 IU/L — ABNORMAL HIGH (ref 0–32)
AST: 28 IU/L (ref 0–40)
Albumin: 4.7 g/dL (ref 3.8–4.9)
Alkaline Phosphatase: 110 IU/L (ref 44–121)
BUN/Creatinine Ratio: 14 (ref 9–23)
BUN: 12 mg/dL (ref 6–24)
Bilirubin Total: 0.3 mg/dL (ref 0.0–1.2)
CO2: 28 mmol/L (ref 20–29)
Calcium: 10.4 mg/dL — ABNORMAL HIGH (ref 8.7–10.2)
Chloride: 96 mmol/L (ref 96–106)
Creatinine, Ser: 0.85 mg/dL (ref 0.57–1.00)
Globulin, Total: 3.1 g/dL (ref 1.5–4.5)
Glucose: 108 mg/dL — ABNORMAL HIGH (ref 70–99)
Potassium: 4.2 mmol/L (ref 3.5–5.2)
Sodium: 142 mmol/L (ref 134–144)
Total Protein: 7.8 g/dL (ref 6.0–8.5)
eGFR: 80 mL/min/{1.73_m2} (ref >59)

## 2023-06-24 LAB — CBC WITH DIFFERENTIAL/PLATELET
Basophils Absolute: 0.1 10*3/uL (ref 0.0–0.2)
Basos: 1 %
EOS (ABSOLUTE): 0.2 10*3/uL (ref 0.0–0.4)
Eos: 2 %
Hematocrit: 46.7 % — ABNORMAL HIGH (ref 34.0–46.6)
Hemoglobin: 15.7 g/dL (ref 11.1–15.9)
Immature Grans (Abs): 0 10*3/uL (ref 0.0–0.1)
Immature Granulocytes: 0 %
Lymphocytes Absolute: 2.6 10*3/uL (ref 0.7–3.1)
Lymphs: 23 %
MCH: 31 pg (ref 26.6–33.0)
MCHC: 33.6 g/dL (ref 31.5–35.7)
MCV: 92 fL (ref 79–97)
Monocytes Absolute: 1.1 10*3/uL — ABNORMAL HIGH (ref 0.1–0.9)
Monocytes: 10 %
Neutrophils Absolute: 7.2 10*3/uL — ABNORMAL HIGH (ref 1.4–7.0)
Neutrophils: 64 %
Platelets: 469 10*3/uL — ABNORMAL HIGH (ref 150–450)
RBC: 5.07 x10E6/uL (ref 3.77–5.28)
RDW: 12.7 % (ref 11.7–15.4)
WBC: 11.1 10*3/uL — ABNORMAL HIGH (ref 3.4–10.8)

## 2023-06-24 LAB — LIPID PANEL
Chol/HDL Ratio: 4.1 ratio (ref 0.0–4.4)
Cholesterol, Total: 277 mg/dL — ABNORMAL HIGH (ref 100–199)
HDL: 68 mg/dL (ref >39)
LDL Chol Calc (NIH): 167 mg/dL — ABNORMAL HIGH (ref 0–99)
Triglycerides: 229 mg/dL — ABNORMAL HIGH (ref 0–149)
VLDL Cholesterol Cal: 42 mg/dL — ABNORMAL HIGH (ref 5–40)

## 2023-06-24 MED ORDER — TIRZEPATIDE-WEIGHT MANAGEMENT 2.5 MG/0.5ML ~~LOC~~ SOAJ: 2.5000 mg | SUBCUTANEOUS | 0 refills | Status: DC

## 2023-06-24 NOTE — Progress Notes (Signed)
 Alicia Steele, that calcium still just slightly elevated again not a worrisome range so we will just keep an eye on it.  The ALT liver enzyme is still just off by 1 point again not in a worrisome range but we will continue to keep an eye on it your numbers often times just a couple points up.  Total cholesterol and LDL are high as well as your triglycerides.  Though your triglycerides are actually a little better this time compared to last year.  You have good HDL though which is great.  Just encourage you to continue to work on healthy diet and regular exercise.  No anemia.  Your platelets are up a little bit but they have jumped up like that before so I think you just always tend to run a little bit on the higher end of normal.  Just a reminder to get your mammogram scheduled when you get a chance as well as your Pap smear screening.  The 10-year ASCVD risk score (Arnett DK, et al., 2019) is: 4.1%   Values used to calculate the score:     Age: 64 years     Sex: Female     Is Non-Hispanic African American: No     Diabetic: No     Tobacco smoker: No     Systolic Blood Pressure: 135 mmHg     Is BP treated: Yes     HDL Cholesterol: 68 mg/dL     Total Cholesterol: 277 mg/dL

## 2023-06-24 NOTE — Assessment & Plan Note (Signed)
 Visit #: 1 Starting Weight: 229 lbs   Current weight: 229 lb  Previous weight: Change in weight:  Goal weight: Dietary goals: work on setting caloric goals Exercise goals: physically active at work.  Medication: wil try to get Zepbound covered.   Follow-up and referrals: 8 weeks.

## 2023-06-24 NOTE — Telephone Encounter (Signed)
 Pharmacy Patient Advocate Encounter   Received notification from Patient Pharmacy that prior authorization for Zepbound 2.5 is required/requested.   Insurance verification completed.   The patient is insured through [facility] .   Per test claim: PA required; PA submitted to above mentioned insurance via CoverMyMeds Key/confirmation #/EOC [id number] Status is pending

## 2023-06-25 ENCOUNTER — Other Ambulatory Visit (HOSPITAL_COMMUNITY): Payer: Self-pay

## 2023-06-26 ENCOUNTER — Other Ambulatory Visit (HOSPITAL_COMMUNITY): Payer: Self-pay

## 2023-06-29 NOTE — Telephone Encounter (Signed)
 Pls notirfy pt of denial

## 2023-06-29 NOTE — Telephone Encounter (Signed)
 The only other option for prediabetes would be metformin  when she believes she has tried before.

## 2023-06-29 NOTE — Telephone Encounter (Signed)
 Pharmacy Patient Advocate Encounter  Received notification from [facility] that Prior Authorization for Zepbound  2.5 has been DENIED.  Full denial letter will be uploaded to the media tab. See denial reason below.   PA #/Case ID/Reference #: [id number]

## 2023-06-29 NOTE — Telephone Encounter (Signed)
 Patient informed of zepbound  denial - wanting to know what other options she has for treatment of her insulin resistance. What DR. Metheney would recommend she do from here.

## 2023-06-29 NOTE — Addendum Note (Signed)
 Addended by: Jamieon Lannen D on: 06/29/2023 04:46 PM   Modules accepted: Orders

## 2023-06-30 NOTE — Telephone Encounter (Signed)
 Attempted call to patient. Left a voice mail message requesting a return call.

## 2023-07-01 NOTE — Telephone Encounter (Signed)
 1C is not high enough for them to cover it.

## 2023-07-01 NOTE — Telephone Encounter (Signed)
 Patient states she will call back and schedule a follow up visit for 3 months for  IFG follow up

## 2023-07-06 ENCOUNTER — Other Ambulatory Visit (HOSPITAL_COMMUNITY): Payer: Self-pay

## 2023-07-06 NOTE — Telephone Encounter (Signed)
 Per Geraldean Klein at [phone], patients pharmacy needs to contact this number to process the claim.

## 2023-07-11 ENCOUNTER — Other Ambulatory Visit: Payer: Self-pay | Admitting: Family Medicine

## 2023-07-11 DIAGNOSIS — R7301 Impaired fasting glucose: Secondary | ICD-10-CM

## 2023-07-13 ENCOUNTER — Other Ambulatory Visit: Payer: Self-pay | Admitting: Family Medicine

## 2023-07-13 DIAGNOSIS — I10 Essential (primary) hypertension: Secondary | ICD-10-CM

## 2023-07-31 ENCOUNTER — Other Ambulatory Visit: Payer: Self-pay | Admitting: Family Medicine

## 2023-07-31 DIAGNOSIS — F0630 Mood disorder due to known physiological condition, unspecified: Secondary | ICD-10-CM

## 2023-08-04 ENCOUNTER — Telehealth: Payer: Self-pay

## 2023-08-04 NOTE — Telephone Encounter (Signed)
 Attempted to contact the patient regarding the auto refill request for citalopram  from pharmacy. Last filled on 06/01/23, #30. No answer, unable leave a vm msg. Voicemail was full. Is refill appropriate?

## 2023-08-04 NOTE — Telephone Encounter (Signed)
 Copied from CRM [id number]. Topic: Clinical - Request for Lab/Test Order >> Aug 04, 2023  4:06 PM Danelle Dunning F wrote: Reason for CRM:    Patient is calling to check in on a callback and also scheduled for 3 month follow up with PCP on September 23, 2023 at 9:50am. Patient is in need of all routine labs to be placed in order for them to be retrieved at the appointment mentioned.  Please be sure to place labs and contact patient for scheduling if PCP would like the labs taken prior to her appointment on July 16th.  Callback Number: [phone]

## 2023-08-08 IMAGING — MG MM BREAST BX W/ LOC DEV 1ST LESION IMAGE BX SPEC STEREO GUIDE*R*
7 of 8 series · 7 of 16 positions shown · non-contrast
Comparison: Previous exams.
COMPARISON: Previous exams.

Addendum:
CLINICAL DATA: Biopsy of calcifications in the lateral central
posterior right breast and calcifications in the upper outer right
breast at a mid depth.

EXAM:
RIGHT BREAST STEREOTACTIC CORE NEEDLE BIOPSY

[R (1 of 5)]
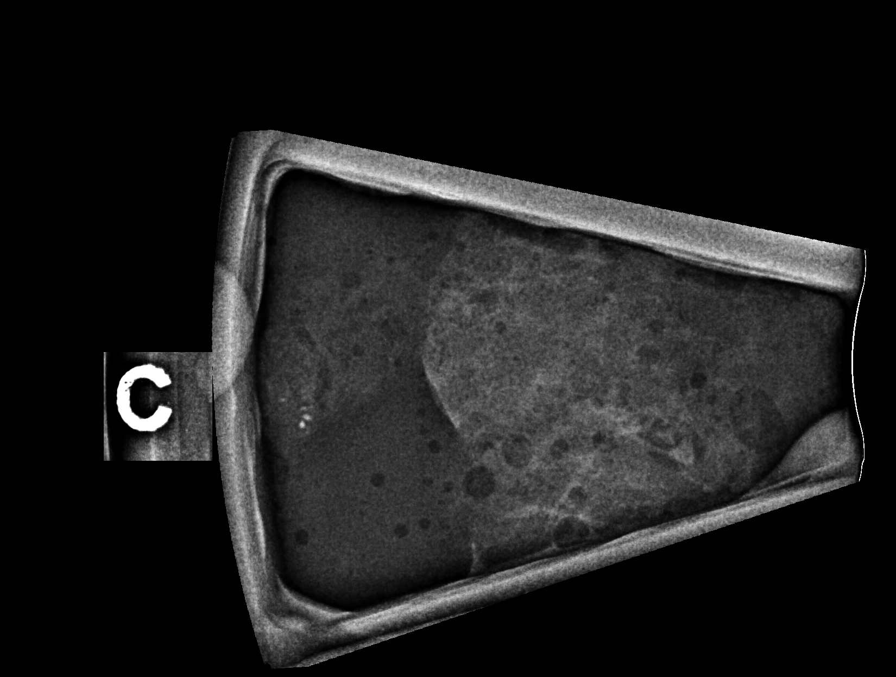

[R (2 of 5)]
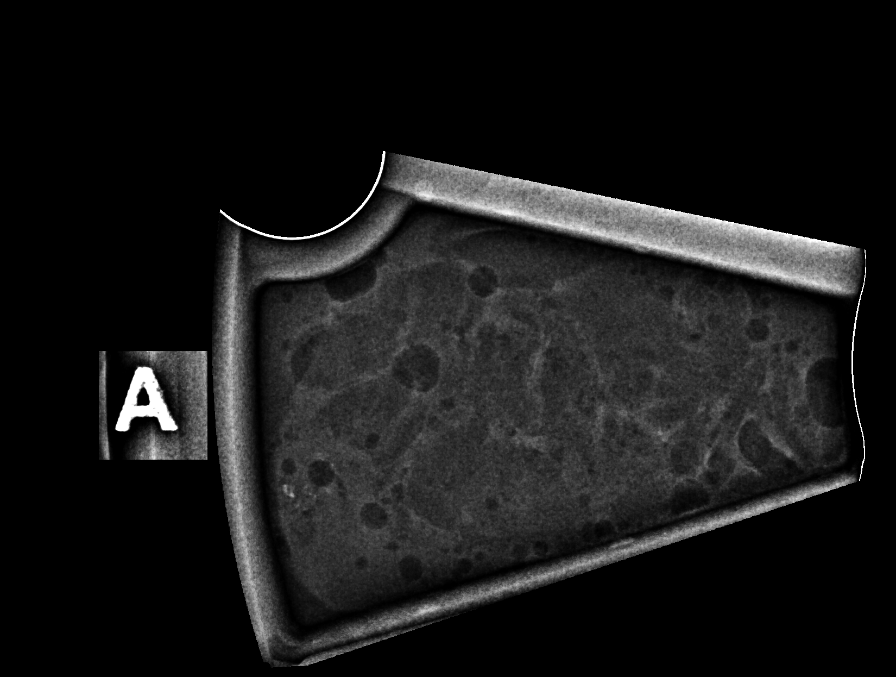

[R (3 of 5)]
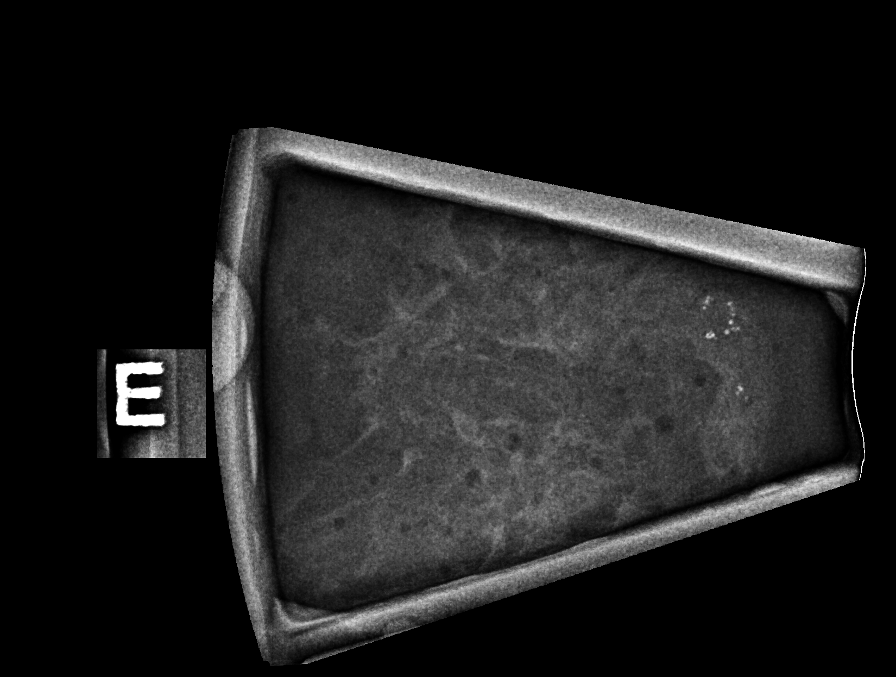

[R (4 of 5)]
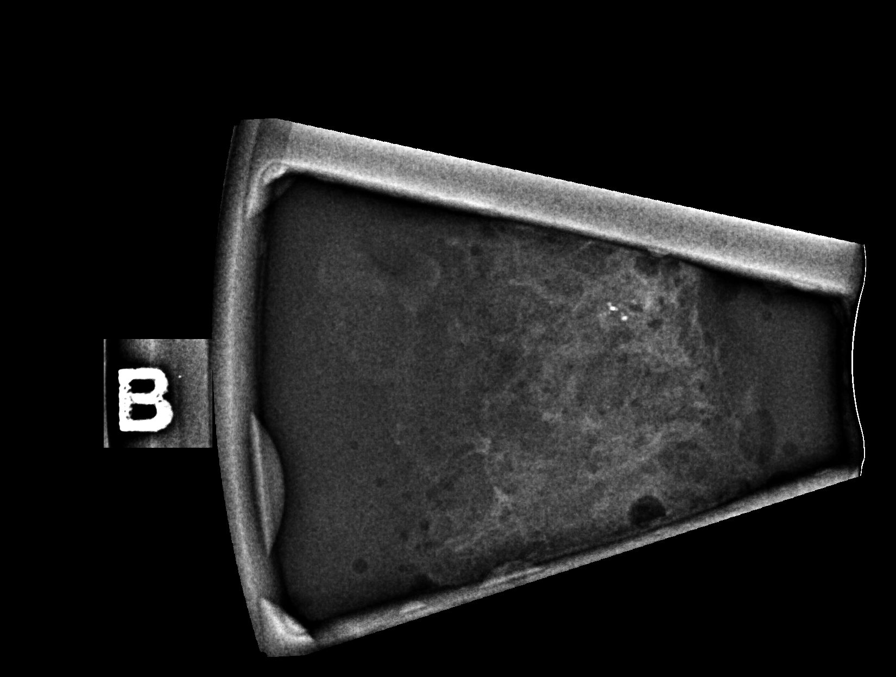

[R (5 of 5)]
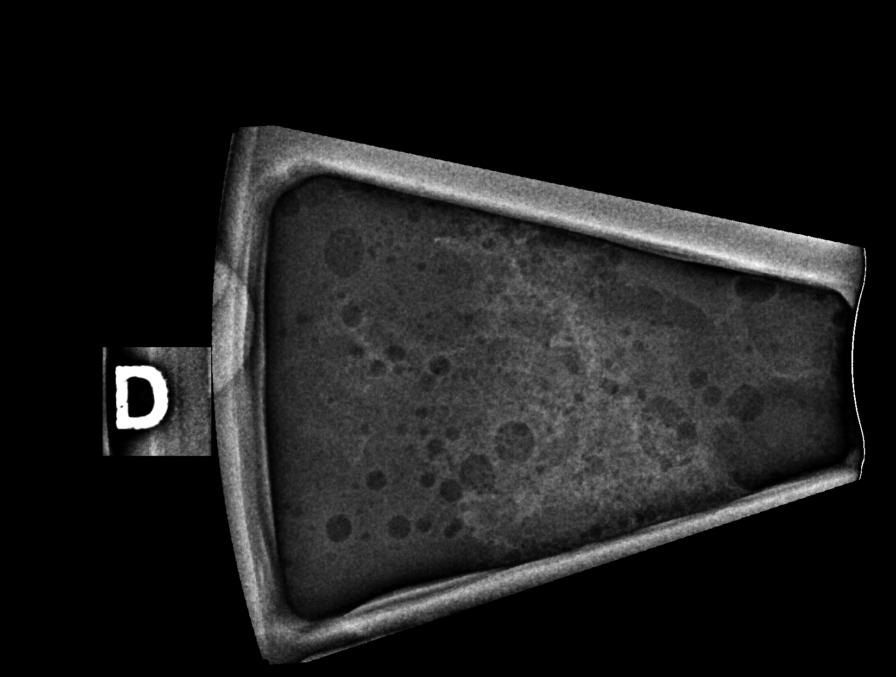

[R CC]
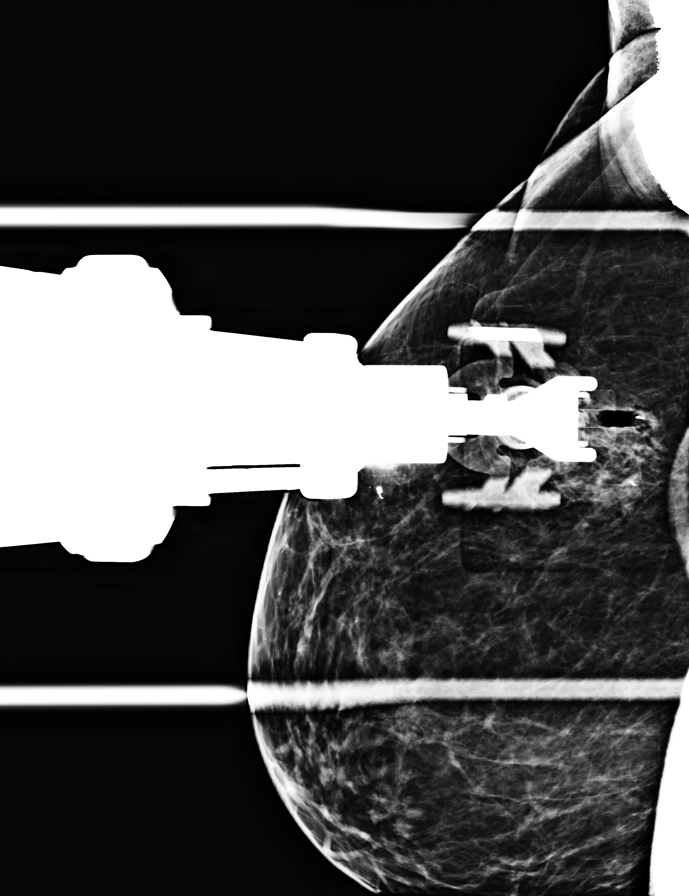

[R CC tomo · tomo slice 32/63.0]
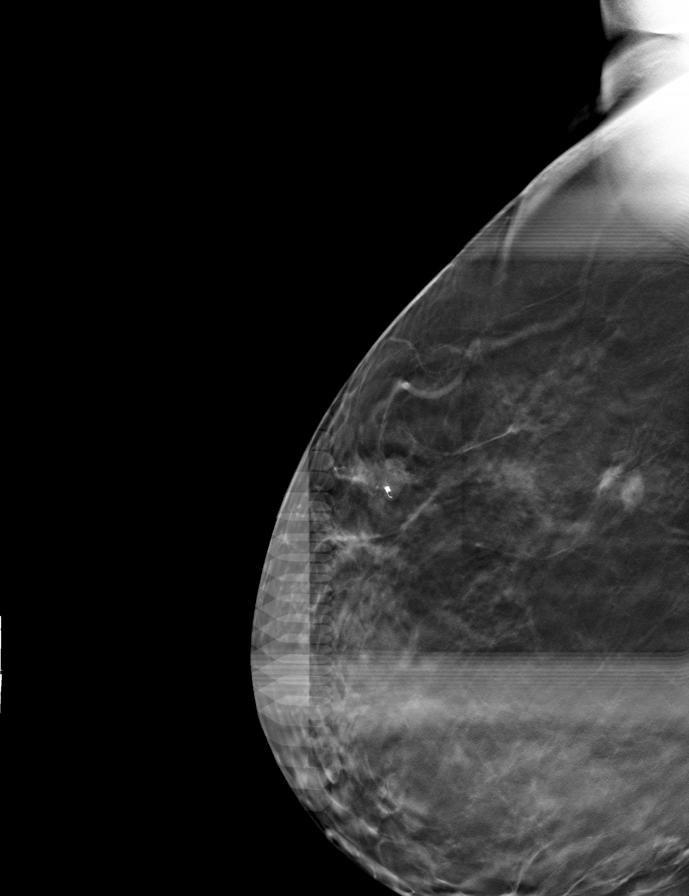

[7 of 16 positions shown; findings below may reference images not displayed]



Using sterile technique and 1% Lidocaine as local anesthetic, under
stereotactic guidance, a 9 gauge vacuum assisted device was used to
perform core needle biopsy of calcifications in the lateral central
posterior right breast using a superior approach. Specimen
radiograph was performed showing calcifications in the core
specimens. Specimens with calcifications are identified for
pathology.

Lesion quadrant: Lateral central posterior right breast

At the conclusion of the procedure, a ribbon shaped tissue marker
clip was deployed into the biopsy cavity. Follow-up 2-view mammogram
was performed and dictated separately.

Using sterile technique and 1% Lidocaine as local anesthetic, under
stereotactic guidance, a 9 gauge vacuum assisted device was used to
perform core needle biopsy of calcifications in the upper outer
right breast at a mid depth using a superior approach. Specimen
radiograph was performed showing calcifications within the core
specimens. Specimens with calcifications are identified for
pathology.

Lesion quadrant: Upper outer right breast at a mid depth

At the conclusion of the procedure, 2 X shaped tissue marker clips
were deployed into the biopsy cavity. Follow-up 2-view mammogram was
performed and dictated separately.
IMPRESSION: Stereotactic-guided biopsy of 2 groups of right breast
calcifications as above. No apparent complications.

ADDENDUM:
Pathology revealed FIBROCYSTIC CHANGE WITH APOCRINE METAPLASIA AND
CALCIFICATIONS pf the Right breast, lateral central posterior,
(ribbon clip). This was found to be concordant by Dr. Tayma
Gilles.

Pathology revealed FIBROCYSTIC CHANGE WITH APOCRINE METAPLASIA AND
CALCIFICATIONS of the Right breast, upper outer breast mid depth, (x
clip). This was found to be concordant by Dr. Damond Armas.

Pathology results were discussed with the patient by telephone. The
patient reported doing well after the biopsies with tenderness at
the sites. Post biopsy instructions and care were reviewed and
questions were answered. The patient was encouraged to call The
direct phone number was provided.

The patient has biopsy proven COMPLEX SCLEROSING LESION WITH
ATYPICAL DUCTAL HYPERPLASIA AND CALCIFICATIONS of the RIGHT breast,
upper outer quadrant, middle, (coil clip), with surgical
consultation for consideration of excision scheduled with Dr. Pirali
Bilmirem at [REDACTED] on November 02, 2020.

Pathology results reported by Yumarie Lule, RN on 10/25/2020.



Using sterile technique and 1% Lidocaine as local anesthetic, under
stereotactic guidance, a 9 gauge vacuum assisted device was used to
perform core needle biopsy of calcifications in the lateral central
posterior right breast using a superior approach. Specimen
radiograph was performed showing calcifications in the core
specimens. Specimens with calcifications are identified for
pathology.

Lesion quadrant: Lateral central posterior right breast

At the conclusion of the procedure, a ribbon shaped tissue marker
clip was deployed into the biopsy cavity. Follow-up 2-view mammogram
was performed and dictated separately.

Using sterile technique and 1% Lidocaine as local anesthetic, under
stereotactic guidance, a 9 gauge vacuum assisted device was used to
perform core needle biopsy of calcifications in the upper outer
right breast at a mid depth using a superior approach. Specimen
radiograph was performed showing calcifications within the core
specimens. Specimens with calcifications are identified for
pathology.

Lesion quadrant: Upper outer right breast at a mid depth

At the conclusion of the procedure, 2 X shaped tissue marker clips
were deployed into the biopsy cavity. Follow-up 2-view mammogram was
performed and dictated separately.
IMPRESSION: Stereotactic-guided biopsy of 2 groups of right breast
calcifications as above. No apparent complications.

## 2023-08-11 NOTE — Telephone Encounter (Signed)
 Pt advised of not needing to have labs done prior to her appointment.   She was asking about getting an alternative medication (compound) that could be sent in to help her with the cost. She stated that she has a relative that is doing this. I asked that she get the name of the pharmacy that she would like to have this sent to and that Dr. Greer Leak would need to review and send it in for her.

## 2023-08-13 ENCOUNTER — Encounter: Payer: Self-pay | Admitting: Family Medicine

## 2023-08-14 NOTE — Telephone Encounter (Signed)
 Forwarding message to [facility] covering Dr. Donnamaria Gable Zepbound  not showing in patient current med list  Does show in past med list list written in April.  Shown as denied by insurance in April  Last OV 06/23/2023 Upcoming appt 09/23/2023

## 2023-08-14 NOTE — Telephone Encounter (Signed)
 Do you know anything about this? Is she a new start?

## 2023-08-25 NOTE — Telephone Encounter (Signed)
 Started looking into this when you were out of town but just got sent back to me. Will let you address.

## 2023-08-25 NOTE — Telephone Encounter (Signed)
 Per notes from 06/22/23, this is for a new start. Patient is currently taking Victoza  for pre-diabetes and for weight management.

## 2023-08-26 NOTE — Telephone Encounter (Signed)
 Lets just call patient and verify that she is wanting the compounded tirzepatide  from [facility] in [place].  I think that is what she is talking about but I just want to double check before I send over a new prescription.

## 2023-08-28 ENCOUNTER — Telehealth: Payer: Self-pay | Admitting: *Deleted

## 2023-08-28 MED ORDER — AMBULATORY NON FORMULARY MEDICATION: Status: DC

## 2023-08-28 NOTE — Telephone Encounter (Signed)
 Faxed to [facility]. Confirmation received.

## 2023-08-28 NOTE — Telephone Encounter (Signed)
 Pls fax order form today.placed in tonya b basket

## 2023-09-23 ENCOUNTER — Ambulatory Visit: Admitting: Family Medicine

## 2023-09-23 ENCOUNTER — Encounter: Payer: Self-pay | Admitting: Family Medicine

## 2023-09-23 VITALS — BP 121/72 | HR 91 | Ht 65.0 in | Wt 243.5 lb

## 2023-09-23 DIAGNOSIS — E8881 Metabolic syndrome: Secondary | ICD-10-CM

## 2023-09-23 DIAGNOSIS — R7301 Impaired fasting glucose: Secondary | ICD-10-CM

## 2023-09-23 DIAGNOSIS — Z7689 Persons encountering health services in other specified circumstances: Secondary | ICD-10-CM

## 2023-09-23 DIAGNOSIS — I10 Essential (primary) hypertension: Secondary | ICD-10-CM

## 2023-09-23 LAB — POCT GLYCOSYLATED HEMOGLOBIN (HGB A1C): Hemoglobin A1C: 5.7 % — AB (ref 4.0–5.6)

## 2023-09-23 NOTE — Progress Notes (Unsigned)
 Established Patient Office Visit  Subjective  Patient ID: Alicia Steele, female    DOB: Aug 07, 1965  Age: 58 y.o. MRN: [id number]  Chief Complaint  Patient presents with   Hypertension   ifg    HPI  Hypertension- Pt denies chest pain, SOB, dizziness, or heart palpitations.  Taking meds as directed w/o problems.  Denies medication side effects.     Impaired fasting glucose-no increased thirst or urination. No symptoms consistent with hypoglycemia.  She is doing okay overall.  She had decided to do the compounded tirzepatide  and we had sent that to the compounding pharmacy but she said she actually never got a phone call from them.     ROS    Objective:     BP 121/72   Pulse 91   Ht 5' 5 (1.651 m)   Wt 243 lb 8 oz (110.5 kg)   SpO2 98%   BMI 40.52 kg/m    Physical Exam Vitals and nursing note reviewed.  Constitutional:      Appearance: Normal appearance.  HENT:     Head: Normocephalic and atraumatic.  Eyes:     Conjunctiva/sclera: Conjunctivae normal.  Cardiovascular:     Rate and Rhythm: Normal rate and regular rhythm.  Pulmonary:     Effort: Pulmonary effort is normal.     Breath sounds: Normal breath sounds.  Skin:    General: Skin is warm and dry.  Neurological:     Mental Status: She is alert.  Psychiatric:        Mood and Affect: Mood normal.      Results for orders placed or performed in visit on 09/23/23  POCT HgB A1C  Result Value Ref Range   Hemoglobin A1C 5.7 (A) 4.0 - 5.6 %   HbA1c POC (<> result, manual entry)     HbA1c, POC (prediabetic range)     HbA1c, POC (controlled diabetic range)        The 10-year ASCVD risk score (Arnett DK, et al., 2019) is: 3.3%    Assessment & Plan:   Problem List Items Addressed This Visit       Cardiovascular and Mediastinum   HYPERTENSION, BENIGN   Blood pressure looks great today.  Continue current regimen.        Endocrine   IFG (impaired fasting glucose) - Primary   1C is up  slightly to 5.7 it was back down to 5.3 at last time.  Just really encouraged her to work on cutting back on carbohydrates and working on weight loss.  She does not eat a lot of processed foods and does eat a lot of variety of vegetables and fruits.      Relevant Orders   POCT HgB A1C (Completed)     Other   Encounter for weight management   Visit #: 1 Starting Weight: 243 lbs/BMI 40.52  Current weight: 243 lbs  Previous weight:  Change in weight: Goal weight: Dietary goals: Working on cutting back on portions.  She does a great job with eating whole foods and avoiding eating out and eating processed foods. Exercise goals: She is very physically active with her job so just encouraged her to continue to be active for now.  Plan to maybe incorporate some resistance training at some point. Medication: Plan is to start the compounded tirzepatide .  Will have to call the pharmacy and see if they got the prescription that we had sent in or if we need to resend it. Follow-up and  referrals: 6 weeks.       Dysmetabolic syndrome   Does have dysmetabolic syndrome with morbid obesity, BMI of 40, hyperlipidemia, hypertension and impaired fasting glucose.  So it is very critical that we work on weight management for her to reduce these complications.      Courage to schedule mammogram.  Return in about 3 months (around 12/24/2023) for weight management .    Dorothyann Byars, MD

## 2023-09-24 DIAGNOSIS — E8881 Metabolic syndrome: Secondary | ICD-10-CM | POA: Insufficient documentation

## 2023-09-24 NOTE — Assessment & Plan Note (Signed)
 Does have dysmetabolic syndrome with morbid obesity, BMI of 40, hyperlipidemia, hypertension and impaired fasting glucose.  So it is very critical that we work on weight management for her to reduce these complications.

## 2023-09-24 NOTE — Assessment & Plan Note (Addendum)
 Visit #: 1 Starting Weight: 243 lbs/BMI 40.52  Current weight: 243 lbs  Previous weight:  Change in weight: Goal weight: Dietary goals: Working on cutting back on portions.  She does a great job with eating whole foods and avoiding eating out and eating processed foods. Exercise goals: She is very physically active with her job so just encouraged her to continue to be active for now.  Plan to maybe incorporate some resistance training at some point. Medication: Plan is to start the compounded tirzepatide .  Will have to call the pharmacy and see if they got the prescription that we had sent in or if we need to resend it. Follow-up and referrals: 6 weeks.

## 2023-09-24 NOTE — Assessment & Plan Note (Signed)
Blood pressure looks great today.  Continue current regimen.

## 2023-09-24 NOTE — Assessment & Plan Note (Signed)
 1C is up slightly to 5.7 it was back down to 5.3 at last time.  Just really encouraged her to work on cutting back on carbohydrates and working on weight loss.  She does not eat a lot of processed foods and does eat a lot of variety of vegetables and fruits.

## 2023-10-29 ENCOUNTER — Other Ambulatory Visit: Payer: Self-pay | Admitting: Family Medicine

## 2023-10-29 DIAGNOSIS — F0630 Mood disorder due to known physiological condition, unspecified: Secondary | ICD-10-CM

## 2023-10-30 NOTE — Telephone Encounter (Signed)
 Requesting rx rf of citalopram  40mg   Last written 08/05/2023 90 day supply  Last OV 09/23/2023  wt management visit Upcoming appt 12/24/2023 wt management visit

## 2023-11-10 ENCOUNTER — Other Ambulatory Visit: Payer: Self-pay | Admitting: Podiatry

## 2023-11-10 ENCOUNTER — Encounter: Payer: Self-pay | Admitting: Sports Medicine

## 2023-12-24 ENCOUNTER — Ambulatory Visit: Admitting: Family Medicine

## 2023-12-24 ENCOUNTER — Encounter: Payer: Self-pay | Admitting: Family Medicine

## 2023-12-24 VITALS — BP 128/78 | HR 80 | Ht 65.0 in | Wt 240.0 lb

## 2023-12-24 DIAGNOSIS — R7301 Impaired fasting glucose: Secondary | ICD-10-CM

## 2023-12-24 DIAGNOSIS — Z7689 Persons encountering health services in other specified circumstances: Secondary | ICD-10-CM | POA: Diagnosis not present

## 2023-12-24 DIAGNOSIS — I10 Essential (primary) hypertension: Secondary | ICD-10-CM

## 2023-12-24 MED ORDER — MELOXICAM 15 MG PO TABS: 15.0000 mg | ORAL_TABLET | Freq: Every day | ORAL | 3 refills | Status: DC

## 2023-12-24 MED ORDER — FUROSEMIDE 40 MG PO TABS: ORAL_TABLET | ORAL | 2 refills | Status: DC

## 2023-12-24 NOTE — Assessment & Plan Note (Addendum)
 Visit #: 2 Starting Weight: 243 lbs/BMI 40.52   Current weight: 240 lbs  Previous weight: 243 lbs   Change in weight: down 3 lbs  Goal weight: Dietary goals: Working on cutting back on portions.  She does a great job with eating whole foods and avoiding eating out and eating processed foods. Exercise goals: She is very physically active with her job so just encouraged her to continue to be active for now.  Has been trying to get short walks in  Medication: On 75 units  compounded tirzepatide .  Plan to incresae to 100 units in about 2 weeks.  Follow-up and referrals: 8 weeks.

## 2023-12-24 NOTE — Progress Notes (Signed)
 Established Patient Office Visit  Subjective  Patient ID: Alicia Steele, female    DOB: [birth year]  Age: 58 y.o. MRN: [id number]  Chief Complaint  Patient presents with   Medical Management of Chronic Issues    Wgt management     HPI Discussed the use of AI scribe software for clinical note transcription with the patient, who gave verbal consent to proceed.  History of Present Illness Alicia Steele is a 58 year old female who presents for a follow-up regarding her medication management and weight loss.  Medication-related injection site reaction - Currently using compounded medication at a dose of 75 units of tirzepatide . - Develops redness and itchiness at the injection site a couple of days post-injection - Manages local reaction by rotating injection sites  Weight management and gastrointestinal function - Recent weight loss of three pounds - Previously experienced constipation with Victoza  - Currently maintains bowel regularity by consuming three prunes daily  Intermittent fluid retention and musculoskeletal pain - Occasionally uses Lasix  for fluid retention - Uses meloxicam  for foot pain, particularly when working on ladders - Both medications are used intermittently and are from prescriptions over a year old  Dietary and lifestyle habits - Follows a low-salt diet, seasons foods like pinto beans minimally - Practices portion control and calorie management - Engages in short walks for exercise, especially in cooler weather - Maintains hydration by drinking mostly water  Cardiopulmonary symptoms - No chest pain, shortness of breath, or unusual symptoms     ROS    Objective:     BP 128/78   Pulse 80   Ht 5' 5 (1.651 m)   Wt 240 lb (108.9 kg)   SpO2 99%   BMI 39.94 kg/m    Physical Exam Vitals and nursing note reviewed.  Constitutional:      Appearance: Normal appearance.  HENT:     Head: Normocephalic and atraumatic.  Eyes:      Conjunctiva/sclera: Conjunctivae normal.  Cardiovascular:     Rate and Rhythm: Normal rate and regular rhythm.  Pulmonary:     Effort: Pulmonary effort is normal.     Breath sounds: Normal breath sounds.  Skin:    General: Skin is warm and dry.  Neurological:     Mental Status: She is alert.  Psychiatric:        Mood and Affect: Mood normal.      No results found for any visits on 12/24/23.    The 10-year ASCVD risk score (Arnett DK, et al., 2019) is: 3.7%    Assessment & Plan:   Problem List Items Addressed This Visit       Cardiovascular and Mediastinum   HYPERTENSION, BENIGN   Essential hypertension Blood pressure elevated, likely stress-related. No chest pain or dyspnea. - Recheck blood pressure at end of visit.      Relevant Medications   furosemide  (LASIX ) 40 MG tablet     Endocrine   IFG (impaired fasting glucose) - Primary   Metabolic syndrome with impaired fasting glucose Weight loss noted. On 75 mg compounded medication. No constipation, likely due to prune intake. Diet and exercise managed well. - Continue current medication regimen. - Monitor for constipation with dose increase. - Encourage continued dietary management and exercise.        Other   Encounter for weight management   Visit #: 2 Starting Weight: 243 lbs/BMI 40.52   Current weight: 240 lbs  Previous weight: 243 lbs   Change in weight:  down 3 lbs  Goal weight: Dietary goals: Working on cutting back on portions.  She does a great job with eating whole foods and avoiding eating out and eating processed foods. Exercise goals: She is very physically active with her job so just encouraged her to continue to be active for now.  Has been trying to get short walks in  Medication: On 75 units  compounded tirzepatide .  Plan to incresae to 100 units in about 2 weeks.  Follow-up and referrals: 8 weeks.       Assessment and Plan Assessment & Plan   Foot and ankle pain Pain occurs  occasionally, especially on ladders. Meloxicam  used as needed. - Refill meloxicam  prescription as needed.  Edema Intermittent edema, not salt-related. Lasix  used as needed. - Refill Lasix  prescription as needed.  Injection site reaction to compounded medication Redness and itchiness post-injection, not severe or allergic. Rotating sites. - Rotate injection sites to avoid repeated use of the same area. - If itching swelling or redness worsens with continued use of the medication then please let us  know.  General Health Maintenance Discussed mammograms and breast cancer detection. She is hesitant but open to mammogram. - Work on scheduling a mammogram.   Return in about 2 months (around 02/23/2024) for weight mgt .    Dorothyann Byars, MD

## 2023-12-24 NOTE — Assessment & Plan Note (Signed)
 Metabolic syndrome with impaired fasting glucose Weight loss noted. On 75 mg compounded medication. No constipation, likely due to prune intake. Diet and exercise managed well. - Continue current medication regimen. - Monitor for constipation with dose increase. - Encourage continued dietary management and exercise.

## 2023-12-24 NOTE — Assessment & Plan Note (Signed)
 Essential hypertension Blood pressure elevated, likely stress-related. No chest pain or dyspnea. - Recheck blood pressure at end of visit.

## 2023-12-25 ENCOUNTER — Telehealth: Payer: Self-pay

## 2023-12-25 NOTE — Telephone Encounter (Signed)
 Telephoned patient at mobile number. Left a voice message with [facility] (scholarship) scheduling contact information.

## 2024-01-17 ENCOUNTER — Encounter: Payer: Self-pay | Admitting: Emergency Medicine

## 2024-01-17 ENCOUNTER — Ambulatory Visit
Admission: EM | Admit: 2024-01-17 | Discharge: 2024-01-17 | Disposition: A | Discharge status: Home / Self Care | Attending: Family Medicine | Admitting: Family Medicine

## 2024-01-17 DIAGNOSIS — L239 Allergic contact dermatitis, unspecified cause: Secondary | ICD-10-CM | POA: Diagnosis not present

## 2024-01-17 DIAGNOSIS — Z6838 Body mass index (BMI) 38.0-38.9, adult: Secondary | ICD-10-CM | POA: Diagnosis not present

## 2024-01-17 DIAGNOSIS — T7840XA Allergy, unspecified, initial encounter: Secondary | ICD-10-CM | POA: Diagnosis not present

## 2024-01-17 DIAGNOSIS — R21 Rash and other nonspecific skin eruption: Secondary | ICD-10-CM

## 2024-01-17 MED ORDER — METHYLPREDNISOLONE ACETATE 80 MG/ML IJ SUSP
80.0000 mg | Freq: Once | INTRAMUSCULAR | Status: AC
  Administered 2024-01-17: 80 mg via INTRAMUSCULAR

## 2024-01-17 NOTE — ED Provider Notes (Signed)
 Alicia Steele    CSN: [id number] Arrival date & time: 01/17/24  1525      History   Chief Complaint Chief Complaint  Patient presents with   Rash   Insect Bite    HPI Alicia Steele is a 58 y.o. female.   Patient seen he was out of town with friends and thinks she may have gotten a spider bite to the side of her left eye.  She shows me pictures of swelling and erythema, upper eyelid and the lateral face.  She states that ever since then the rash has been spreading.  It went down to her cheek and down into her neck.  Its now been 2 weeks and she is continuing to have an itchy rash.  The last couple of days she has developed rash on her abdomen.  She wonders if it is related.  She has no known allergies.  Does not usually have sensitive skin.  Went to an outside urgent Steele center was offered a steroid pack.  She prefers a shot so is here requesting that.    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 Works well.     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      Home Medications    Prior to Admission medications   Medication Sig Start Date End Date Taking? Authorizing Provider  AMBULATORY NON FORMULARY MEDICATION Medication Name: Lipo Slim Inj.  25 units SQ weekly x 4 weeks, 50 units SQ weekly x 4 week, 75 units SQ weekly x 4 weeks, 100 units SQ x 4 weeks, then 150 units SQ weekly 08/28/23  Yes Alvan Dorothyann BIRCH, MD  amLODipine  (NORVASC ) 5 MG tablet TAKE 1 TABLET BY MOUTH DAILY 07/14/23  Yes Alvan Dorothyann BIRCH, MD  citalopram  (CELEXA ) 40 MG tablet TAKE 1 TABLET BY MOUTH DAILY 10/30/23  Yes Alvan Dorothyann BIRCH, MD  furosemide  (LASIX ) 40 MG tablet TAKE 1 TABLET BY MOUTH DAILY AS NEEDED FOR EDEMA 12/24/23  Yes Alvan Dorothyann BIRCH, MD  losartan -hydrochlorothiazide (HYZAAR) 100-25 MG tablet TAKE 1 TABLET BY MOUTH DAILY 07/14/23  Yes Alvan Dorothyann BIRCH, MD  meloxicam  (MOBIC ) 15 MG tablet Take 1 tablet (15 mg total) by mouth daily. 12/24/23  Yes Alvan Dorothyann BIRCH, MD  pantoprazole  (  PROTONIX ) 40 MG tablet TAKE 1 TABLET BY MOUTH DAILY 01/13/23  Yes Alvan Dorothyann BIRCH, MD    Family History Family History  Problem Relation Age of Onset   Heart disease Mother        MI   Hyperlipidemia Mother    Hypertension Mother    Depression Mother     Social History Social History   Tobacco Use   Smoking status: Never   Smokeless tobacco: Never  Substance Use Topics   Alcohol use: Yes    Comment: occasionally   Drug use: Yes    Types: Marijuana    Comment: occasionally     Allergies   Latex   Review of Systems Review of Systems  See HPI Physical Exam Triage Vital Signs ED Triage Vitals  Encounter Vitals  Group     BP 01/17/24 1541 (!) 148/83     Girls Systolic BP Percentile --      Girls Diastolic BP Percentile --      Boys Systolic BP Percentile --      Boys Diastolic BP Percentile --      Pulse Rate 01/17/24 1541 68     Resp 01/17/24 1541 18     Temp 01/17/24 1541 97.8 F (36.6 C)     Temp Source 01/17/24 1541 Oral     SpO2 01/17/24 1541 96 %     Weight 01/17/24 1540 236 lb (107 kg)     Height 01/17/24 1540 5' 6 (1.676 m)     Head Circumference --      Peak Flow --      Pain Score 01/17/24 1540 0     Pain Loc --      Pain Education --      Exclude from Growth Chart --    No data found.  Updated Vital Signs BP (!) 148/83 (BP Location: Right Arm)   Pulse 68   Temp 97.8 F (36.6 C) (Oral)   Resp 18   Ht 5' 6 (1.676 m)   Wt 107 kg   SpO2 96%   BMI 38.09 kg/m   Physical Exam Constitutional:      General: She is not in acute distress.    Appearance: She is well-developed.     Comments: Overweight  HENT:     Head: Normocephalic and atraumatic.  Eyes:     Conjunctiva/sclera: Conjunctivae normal.     Pupils: Pupils are equal, round, and reactive to light.  Cardiovascular:     Rate and Rhythm: Normal rate.  Pulmonary:     Effort: Pulmonary effort is normal. No respiratory distress.  Musculoskeletal:        General: Normal range of motion.     Cervical back: Normal range of motion.  Skin:    General: Skin is warm and dry.     Findings: No rash.     Comments: Erythematous rash that is most visible on the side of the neck from the angle of the jaw down to the collarbone, left side, small vesicles throughout.  On abdomen there are scattered excoriated 2 to 3 mm lesions  Neurological:     Mental Status: She is alert.      UC Treatments / Results  Labs (all labs ordered are listed, but only abnormal results are displayed) Labs Reviewed - No data to display  EKG   Radiology No results found.  Procedures Procedures (including critical Steele  time)  Medications Ordered in UC Medications  methylPREDNISolone acetate (DEPO-MEDROL)  injection 80 mg (80 mg Intramuscular Given 01/17/24 1558)    Initial Impression / Assessment and Plan / UC Course  I have reviewed the triage vital signs and the nursing notes.  Pertinent labs & imaging results that were available during my Steele of the patient were reviewed by me and considered in my medical decision making (see chart for details).     Rash itches terribly.  Likely allergic.  Do not see any evidence of a infection, tinea, bacterial.  Do not know if it is related to an insect bite.  Patient has been putting a variety of products on it which may be irritating the rash.  She is told to use nothing but emollient while being treated Final Clinical Impressions(s) / UC Diagnoses   Final diagnoses:  Rash and nonspecific skin eruption  Allergic reaction, initial encounter     Discharge Instructions      For the itching take an antihistamine like Claritin or Zyrtec.  Take it twice a day instead of once a day.  You may also take Benadryl at bedtime.  This combination will be the best help I have given you a shot of steroid.  This is a long-acting steroid that should last for several days See your doctor if not improving by next week   ED Prescriptions   None    PDMP not reviewed this encounter.   Maranda Jamee Jacob, MD 01/17/24 [phone]

## 2024-01-17 NOTE — Discharge Instructions (Signed)
 For the itching take an antihistamine like Claritin or Zyrtec.  Take it twice a day instead of once a day.  You may also take Benadryl at bedtime.  This combination will be the best help I have given you a shot of steroid.  This is a long-acting steroid that should last for several days See your doctor if not improving by next week

## 2024-01-17 NOTE — ED Triage Notes (Signed)
 Patient states that she was bitten by something a couple of weeks ago over left eye.  The area became red and swollen, but eventually went away.  Now a rash has developed on the left side of neck down to under her left breast.  The area is extremely itchy.  Applied different topicals to the area.

## 2024-01-23 ENCOUNTER — Other Ambulatory Visit: Payer: Self-pay | Admitting: Family Medicine

## 2024-02-05 ENCOUNTER — Telehealth: Payer: Self-pay | Admitting: Family Medicine

## 2024-02-09 ENCOUNTER — Telehealth: Payer: Self-pay

## 2024-02-09 NOTE — Telephone Encounter (Signed)
 Telephoned patient at mobile number. Patient has private insurance.and is ineligible for mammogram scholarship. [facility] (scholarship)

## 2024-02-23 ENCOUNTER — Encounter: Payer: Self-pay | Admitting: Family Medicine

## 2024-02-23 ENCOUNTER — Ambulatory Visit: Admitting: Family Medicine

## 2024-02-23 VITALS — BP 122/72 | HR 75 | Ht 65.0 in | Wt 235.0 lb

## 2024-02-23 DIAGNOSIS — R7301 Impaired fasting glucose: Secondary | ICD-10-CM | POA: Diagnosis not present

## 2024-02-23 DIAGNOSIS — Z8249 Family history of ischemic heart disease and other diseases of the circulatory system: Secondary | ICD-10-CM | POA: Diagnosis not present

## 2024-02-23 DIAGNOSIS — E66812 Obesity, class 2: Secondary | ICD-10-CM

## 2024-02-23 DIAGNOSIS — R011 Cardiac murmur, unspecified: Secondary | ICD-10-CM | POA: Insufficient documentation

## 2024-02-23 DIAGNOSIS — Z7689 Persons encountering health services in other specified circumstances: Secondary | ICD-10-CM | POA: Diagnosis not present

## 2024-02-23 DIAGNOSIS — Z6838 Body mass index (BMI) 38.0-38.9, adult: Secondary | ICD-10-CM | POA: Diagnosis not present

## 2024-02-23 LAB — POCT GLYCOSYLATED HEMOGLOBIN (HGB A1C): Hemoglobin A1C: 5.3 % (ref 4.0–5.6)

## 2024-02-23 MED ORDER — AMBULATORY NON FORMULARY MEDICATION: 1 refills | Status: DC

## 2024-02-23 MED ORDER — AMBULATORY NON FORMULARY MEDICATION: 1 refills | Status: AC

## 2024-02-23 NOTE — Progress Notes (Signed)
 Established Patient Office Visit  Patient ID: Alicia Steele, female    DOB: 15-Nov-1965  Age: 58 y.o. MRN: [id number] PCP: Alvan Dorothyann BIRCH, MD  Chief Complaint  Patient presents with   Weight Check    Subjective:     HPI  Discussed the use of AI scribe software for clinical note transcription with the patient, who gave verbal consent to proceed.  History of Present Illness Alicia Steele is a 58 year old female who presents with concerns about allergic reactions and medication management.  Allergic reaction - In October, developed swelling of the eye, pruritus, and a rash extending from the face to the neck and under the breast - Initially suspected a spider bite, later considered a medication-related etiology - Received an injection at a physician's office with symptom resolution within hours - No recurrence of the reaction with subsequent medication use, except for minor pruritus at the injection site - Highly allergic to latex; confirmed that the medication stopper was latex-free  Medication management - Uses a compounded medication administered weekly, typically on Thursday or Friday, at a 100 mg dose - Recently experienced difficulty obtaining refills due to lack of pharmacy approval - Associates increased hunger yesterday and today with the medication wearing off - No recurrence of significant allergic reaction with continued use, aside from minor injection site pruritus  Appetite changes - Increased hunger yesterday and today, temporally associated with the medication wearing off  Dietary habits and bowel function - Follows a low-carbohydrate, high-protein diet, but uncertain about adequate protein intake - Avoids high-sugar foods, prefers alternatives such as low-sugar kefir - Maintains regular bowel movements, aided by daily consumption of prunes or mangoes  Cardiac symptoms - History of heart fluttering sensations  Chemical exposure precautions - Works  with pesticides and practices frequent hand washing to minimize chemical exposure   The 10-year ASCVD risk score (Arnett DK, et al., 2019) is: 3.4%   Values used to calculate the score:     Age: 84 years     Clinically relevant sex: Female     Is Non-Hispanic African American: No     Diabetic: No     Tobacco smoker: No     Systolic Blood Pressure: 122 mmHg     Is BP treated: Yes     HDL Cholesterol: 68 mg/dL     Total Cholesterol: 277 mg/dL     ROS    Objective:     BP 122/72   Pulse 75   Ht 5' 5 (1.651 m)   Wt 235 lb (106.6 kg)   SpO2 99%   BMI 39.11 kg/m    Physical Exam Vitals and nursing note reviewed.  Constitutional:      Appearance: Normal appearance.  HENT:     Head: Normocephalic and atraumatic.  Eyes:     Conjunctiva/sclera: Conjunctivae normal.  Cardiovascular:     Rate and Rhythm: Normal rate and regular rhythm.     Heart sounds: Murmur heard.     Comments: 2/6 SEM Pulmonary:     Effort: Pulmonary effort is normal.     Breath sounds: Normal breath sounds.  Skin:    General: Skin is warm and dry.  Neurological:     Mental Status: She is alert.  Psychiatric:        Mood and Affect: Mood normal.      Results for orders placed or performed in visit on 02/23/24  POCT HgB A1C  Result Value Ref Range  Hemoglobin A1C 5.3 4.0 - 5.6 %   HbA1c POC (<> result, manual entry)     HbA1c, POC (prediabetic range)     HbA1c, POC (controlled diabetic range)        The 10-year ASCVD risk score (Arnett DK, et al., 2019) is: 3.4%    Assessment & Plan:   Problem List Items Addressed This Visit       Endocrine   IFG (impaired fasting glucose)   Relevant Medications   AMBULATORY NON FORMULARY MEDICATION   Other Relevant Orders   POCT HgB A1C (Completed)     Other   Encounter for weight management - Primary   Relevant Medications   AMBULATORY NON FORMULARY MEDICATION   Class 2 severe obesity due to excess calories with serious comorbidity  and body mass index (BMI) of 38.0 to 38.9 in adult   Relevant Medications   AMBULATORY NON FORMULARY MEDICATION   Other Visit Diagnoses       Family history of heart disease       Relevant Orders   CT CARDIAC SCORING (SELF PAY ONLY)       Assessment and Plan Assessment & Plan Weight management Increased hunger suggests current Tirzepatide  dose may be insufficient. Progress noted with 5-pound weight loss since October. Discussed dietary habits and cost savings with 90-day supply. - Prescribed a 90-day supply of Tirzepatide . - Encouraged continued low carb, high protein diet with variety in protein sources.  Injection site reaction to compounded medication Itchy spots at injection sites noted. No latex in vial stopper. Previous severe reaction to compounded medication, current reactions milder. Discussed potential allergic reactions and monitoring. - Continue to monitor for any worsening reactions to compounded medication. - Advised carrying Benadryl for potential allergic reactions.  Cardiac risk assessment for heart murmur and palpitations Occasional palpitations and heart murmur with family history of premature heart disease. Discussed cardiac calcium CT scan for plaque assessment and implications for cholesterol management. - Ordered cardiac calcium CT scan to assess for plaque buildup. - Will discuss potential need for statin therapy based on scan results.  Return in about 3 months (around 05/23/2024) for weight mgt .    Dorothyann Byars, MD [facility] at [facility]

## 2024-03-21 ENCOUNTER — Ambulatory Visit (INDEPENDENT_AMBULATORY_CARE_PROVIDER_SITE_OTHER): Payer: Self-pay

## 2024-03-21 DIAGNOSIS — Z8249 Family history of ischemic heart disease and other diseases of the circulatory system: Secondary | ICD-10-CM

## 2024-03-25 ENCOUNTER — Ambulatory Visit: Payer: Self-pay | Admitting: Family Medicine

## 2024-03-25 DIAGNOSIS — R931 Abnormal findings on diagnostic imaging of heart and coronary circulation: Secondary | ICD-10-CM | POA: Insufficient documentation

## 2024-03-25 MED ORDER — ROSUVASTATIN CALCIUM 20 MG PO TABS: 20.0000 mg | ORAL_TABLET | Freq: Every day | ORAL | 3 refills | Status: AC

## 2024-03-25 MED ORDER — ASPIRIN 81 MG PO TBEC: 81.0000 mg | DELAYED_RELEASE_TABLET | Freq: Every day | ORAL | 1 refills | Status: AC

## 2024-03-25 NOTE — Progress Notes (Signed)
 Hi Alicia Steele, your coronary calcium  score was extremely high at 1249 this was the 99th percentile for a female your age.  I would strongly strongly recommend starting a statin to reduce your risk for heart attack or stroke as well as a low-dose baby aspirin , 81 mg daily and a cardiology consult.  We have Dr. Redell Shallow who is here in our building but we can also get you set up with someone of your preference.  Please let me know in regard to the cardiology referral but I am going to go ahead and send over a statin and encourage you to pick up 81 mg enteric-coated aspirin  over-the-counter.

## 2024-03-25 NOTE — Telephone Encounter (Signed)
 or

## 2024-03-28 ENCOUNTER — Other Ambulatory Visit: Payer: Self-pay | Admitting: Family Medicine

## 2024-03-28 DIAGNOSIS — I10 Essential (primary) hypertension: Secondary | ICD-10-CM

## 2024-03-31 NOTE — Progress Notes (Unsigned)
 " Cardiology Office Note:    Date:  04/01/2024   ID:  Alicia Steele, DOB 03-19-1965, MRN [id number]  PCP:  Alvan Dorothyann BIRCH, MD   [facility] Providers Cardiologist:  None     Referring MD: Alvan Dorothyann BIRCH, MD   Chief Complaint  Patient presents with   Coronary Artery Disease    History of Present Illness:    Alicia Steele is a 59 y.o. female seen at the request of Dr Alvan for evaluation of CAD. She has a history of HTN, HLD, and PCOS. She had recent coronary calcium  score of 1249.   She denies any symptoms of SOB, chest pain. Previously had some mild swelling but this resolved since she is on Mounjaro . She  has not yet  started statin therapy. 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 Family history is significant in that mother had MI at age 23 and died age 62. Sister also has CAD.   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 Works well.     PCOS (polycystic ovarian syndrome)    PONV (postoperative nausea and vomiting)     Past Surgical History:  Procedure Laterality Date   BREAST LUMPECTOMY WITH RADIOACTIVE SEED LOCALIZATION Right 12/06/2020   Procedure: RIGHT BREAST LUMPECTOMY WITH RADIOACTIVE SEED LOCALIZATION;  Surgeon: Curvin Deward MOULD, MD;  Location: [facility];  Service: General;  Laterality: Right;   TONSILLECTOMY     uterine ablation  02/2009    Current Medications: Active Medications[1]   Allergies:   Latex   Social History   Socioeconomic History   Marital status: Single    Spouse name: Not on file   Number of children: Not on file   Years of education: Not on file   Highest education level: Not on file  Occupational History   Not on file  Tobacco Use   Smoking status: Never   Smokeless tobacco: Never  Substance and Sexual  Activity   Alcohol use: Yes    Comment: occasionally   Drug use: Yes    Types: Marijuana    Comment: occasionally   Sexual activity: Not on file  Other Topics Concern   Not on file  Social History Narrative   Pest control company   Social Drivers of Health   Tobacco Use: Low Risk (04/01/2024)   Patient History    Smoking Tobacco Use: Never    Smokeless Tobacco Use: Never    Passive Exposure: Not on file  Financial Resource Strain: Not on file  Food Insecurity: Not on file  Transportation Needs: Not on file  Physical Activity: Not on file  Stress: Not on file  Social Connections: Unknown (07/20/2021)   [facility]   Social Network    Social Network: Not on file  Depression (PHQ2-9): Low Risk (02/23/2024)   Depression (PHQ2-9)    PHQ-2 Score: 0  Alcohol Screen: Not on file  Housing: Not on file  Utilities: Not on file  Health Literacy: Not on file     Family History: The patient's family history includes Depression in her mother; Heart disease in her sister; Heart disease (age of onset: 24) in her mother; Hyperlipidemia in her mother; Hypertension in her mother; Stroke in her maternal aunt and paternal grandmother.  ROS:  Please see the history of present illness.     All other systems reviewed and are negative.  EKGs/Labs/Other Studies Reviewed:    The following studies were reviewed today: Coronary Calcium  Score   TECHNIQUE: A gated, non-contrast computed tomography scan of the heart was performed using 3 mm slice thickness. Axial images were analyzed on a dedicated workstation. Calcium  scoring of the coronary arteries was performed using the Agatston method.   FINDINGS: Coronary Calcium  Score:   Left main: 0   Left anterior descending artery: 674   Left circumflex artery: 210   Right coronary artery: 366   Total: 1249   Percentile: 99th   Pericardium: Normal.   Ascending Aorta: Normal caliber. Aortic atherosclerosis.    IMPRESSION: 1. Coronary calcium  score of 1249. This was 99th percentile for age-, race-, and sex-matched controls.   2. Aortic atherosclerosis.   3. Non-cardiac: See separate report from [facility].   EKG Interpretation Date/Time:  Friday April 01 2024 13:55:19 EST Ventricular Rate:  72 PR Interval:  152 QRS Duration:  80 QT Interval:  378 QTC Calculation: 413 R Axis:   4  Text Interpretation: Normal sinus rhythm Inferior infarct , age undetermined Anterior infarct (cited on or before 05-Dec-2020) When compared with ECG of 05-Dec-2020 15:30, Inferior infarct is now Present Confirmed by Archie Shea [phone]) on 04/01/2024 1:57:12 PM    Recent Labs: 06/23/2023: ALT 33; BUN 12; Creatinine, Ser 0.85; Hemoglobin 15.7; Platelets 469; Potassium 4.2; Sodium 142  Recent Lipid Panel    Component Value Date/Time   CHOL 277 (H) 06/23/2023 1532   TRIG 229 (H) 06/23/2023 1532   HDL 68 06/23/2023 1532   CHOLHDL 4.1 06/23/2023 1532   CHOLHDL 4.8 11/21/2021 0000   VLDL 48 (H) 10/22/2015 1206   LDLCALC 167 (H) 06/23/2023 1532   LDLCALC 198 (H) 11/21/2021 0000   EKG Interpretation Date/Time:  Friday April 01 2024 13:55:19 EST Ventricular Rate:  72 PR Interval:  152 QRS Duration:  80 QT Interval:  378 QTC Calculation: 413 R Axis:   4  Text Interpretation: Normal sinus rhythm Inferior infarct , age undetermined Anterior infarct (cited on or before 05-Dec-2020) When compared with ECG of 05-Dec-2020 15:30, Inferior infarct is now Present Confirmed by Jahree Dermody [phone]) on 04/01/2024 1:57:12 PM    Risk Assessment/Calculations:                Physical Exam:    VS:  BP 118/78 (BP Location: Left Arm, Patient Position: Sitting, Cuff Size: Large)   Pulse 68   Ht 5' 6 (1.676 m)   Wt 236 lb 11.2 oz (107.4 kg)   SpO2 98%   BMI 38.20 kg/m     Wt Readings from Last 3 Encounters:  04/01/24 236 lb 11.2 oz (107.4 kg)  02/23/24 235 lb (106.6 kg)  01/17/24 236 lb (107 kg)      GEN:  Well nourished, obese, in no acute distress HEENT: Normal NECK: No JVD; No carotid bruits LYMPHATICS: No lymphadenopathy CARDIAC: RRR, gr 2/6 systolic murmur RUSB.  RESPIRATORY:  Clear to auscultation without rales, wheezing or rhonchi  ABDOMEN: Soft, non-tender, non-distended MUSCULOSKELETAL:  No edema; No deformity  SKIN: Warm and dry NEUROLOGIC:  Alert and oriented x 3 PSYCHIATRIC:  Normal affect   ASSESSMENT:    1. Coronary artery disease involving native coronary artery of native heart without angina pectoris   2. Agatston coronary artery calcium  score greater than 400   3. Heart murmur   4. Abnormal  ECG   5. Primary hypertension    PLAN:    In order of problems listed above:  CAD with very high calcium  score. No symptoms but Ecg suggests prior inferior and anterior MI. Recommend she take ASA 81 mg daily. Continue amlodipine . Will arrange cardiac PET CT to evaluate ischemic risk.  HLD mixed. Very high cholesterol along with family history suggests she has familial Hypercholesterolemia. Recommend aggressive lipid lowering therapy with target LDL < 55. I think Crestor  20 mg daily is a good starting point but she may need more Rx. Would repeat lab in 3 months and include lipoprotein A level. She was hoping to use supplements to lower cholesterol but I informed her this was not effective HTN well controlled PCOS Heart murmur. Will check Echo.  Obesity. On Mounjaro  now.       Informed Consent   Shared Decision Making/Informed Consent The risks [chest pain, shortness of breath, cardiac arrhythmias, dizziness, blood pressure fluctuations, myocardial infarction, stroke/transient ischemic attack, nausea, vomiting, allergic reaction, radiation exposure, metallic taste sensation and life-threatening complications (estimated to be 1 in 10,000)], benefits (risk stratification, diagnosing coronary artery disease, treatment guidance) and alternatives of a cardiac PET stress test  were discussed in detail with Ms. Pinnix and she agrees to proceed.       Medication Adjustments/Labs and Tests Ordered: Current medicines are reviewed at length with the patient today.  Concerns regarding medicines are outlined above.  Orders Placed This Encounter  Procedures   Cardiac Stress Test: Informed Consent Details: Physician/Practitioner Attestation; Transcribe to consent form and obtain patient signature   EKG 12-Lead   No orders of the defined types were placed in this encounter.   Patient Instructions  Medication Instructions:   *If you need a refill on your cardiac medications before your next appointment, please call your pharmacy*  Lab Work:    Testing/Procedures:   Follow-Up: At [facility], you and your health needs are our priority.  As part of our continuing mission to provide you with exceptional heart care, our providers are all part of one team.  This team includes your primary Cardiologist (physician) and Advanced Practice Providers or APPs (Physician Assistants and Nurse Practitioners) who all work together to provide you with the care you need, when you need it.  Your next appointment:      Provider:      We recommend signing up for the patient portal called MyChart.  Sign up information is provided on this After Visit Summary.  MyChart is used to connect with patients for Virtual Visits (Telemedicine).  Patients are able to view lab/test results, encounter notes, upcoming appointments, etc.  Non-urgent messages can be sent to your provider as well.   To learn more about what you can do with MyChart, go to [email or web address].             Signed, Asyria Kolander, MD  04/01/2024 2:25 PM    [facility]     [1]  Current Meds  Medication Sig   AMBULATORY NON FORMULARY MEDICATION Medication Name: Lipo Slim Inj. 150 units SQ weekly [facility] Fax:[phone]   amLODipine  (NORVASC ) 5 MG tablet TAKE 1 TABLET BY MOUTH  DAILY   aspirin  EC 81 MG tablet Take 1 tablet (81 mg total) by mouth daily. Swallow whole.   citalopram  (CELEXA ) 40 MG tablet TAKE 1 TABLET BY MOUTH DAILY   losartan -hydrochlorothiazide (HYZAAR) 100-25 MG tablet TAKE 1 TABLET BY MOUTH DAILY   pantoprazole  (PROTONIX ) 40 MG tablet TAKE  1 TABLET BY MOUTH ONCE DAILY   tirzepatide  (MOUNJARO ) 2.5 MG/0.5ML Pen Inject 2.5 mg into the skin.   "

## 2024-04-01 ENCOUNTER — Ambulatory Visit: Payer: Self-pay | Attending: Cardiovascular Disease | Admitting: Cardiology

## 2024-04-01 ENCOUNTER — Encounter: Payer: Self-pay | Admitting: Cardiology

## 2024-04-01 VITALS — BP 118/78 | HR 68 | Ht 66.0 in | Wt 236.7 lb

## 2024-04-01 DIAGNOSIS — R931 Abnormal findings on diagnostic imaging of heart and coronary circulation: Secondary | ICD-10-CM

## 2024-04-01 DIAGNOSIS — I10 Essential (primary) hypertension: Secondary | ICD-10-CM

## 2024-04-01 DIAGNOSIS — I2510 Atherosclerotic heart disease of native coronary artery without angina pectoris: Secondary | ICD-10-CM

## 2024-04-01 DIAGNOSIS — R011 Cardiac murmur, unspecified: Secondary | ICD-10-CM | POA: Diagnosis not present

## 2024-04-01 DIAGNOSIS — R9431 Abnormal electrocardiogram [ECG] [EKG]: Secondary | ICD-10-CM

## 2024-04-01 NOTE — Patient Instructions (Addendum)
 Medication Instructions: Continue same medications Start Crestor  as prescribed Lab Work: None ordered   Testing/Procedures: Schedule Echo  First available    ( No Prep )   Cardiac Pet Scan will be scheduled at [facility]  after approved by insurance    Follow instructions below     Follow-Up: At [facility], you and your health needs are our priority.  As part of our continuing mission to provide you with exceptional heart care, our providers are all part of one team.  This team includes your primary Cardiologist (physician) and Advanced Practice Providers or APPs (Physician Assistants and Nurse Practitioners) who all work together to provide you with the care you need, when you need it.  Your next appointment:  After test     Provider:  Dr.Jordan      Please report to Radiology at the [facility] Main Entrance 30 minutes early for your test.  [address] [place], [place] [address]                         OR   Please report to Radiology at [facility] Main Entrance, medical mall, 30 mins prior to your test.  [address]  [place], [place]  How to Prepare for Your Cardiac PET/CT Stress Test:  Nothing to eat or drink, except water, 3 hours prior to arrival time.  NO caffeine/decaffeinated products, or chocolate 12 hours prior to arrival. (Please note decaffeinated beverages (teas/coffees) still contain caffeine).  If you have caffeine within 12 hours prior, the test will need to be rescheduled.  Medication instructions: Do not take erectile dysfunction medications for 72 hours prior to test (sildenafil, tadalafil) Do not take nitrates (isosorbide mononitrate, Ranexa) the day before or day of test Do not take tamsulosin the day before or morning of test Hold theophylline containing medications for 12 hours. Hold Dipyridamole 48 hours prior to the test.  Diabetic Preparation: If able to eat breakfast prior to 3  hour fasting, you may take all medications, including your insulin. Do not worry if you miss your breakfast dose of insulin - start at your next meal. If you do not eat prior to 3 hour fast-Hold all diabetes (oral and insulin) medications. Patients who wear a continuous glucose monitor MUST remove the device prior to scanning.  You may take your remaining medications with water.  NO perfume, cologne or lotion on chest or abdomen area. FEMALES - Please avoid wearing dresses to this appointment.  Total time is 1 to 2 hours; you may want to bring reading material for the waiting time.   In preparation for your appointment, medication and supplies will be purchased.  Appointment availability is limited, so if you need to cancel or reschedule, please call the Radiology Department Scheduler at [phone] 24 hours in advance to avoid a cancellation fee of $100.00  What to Expect When you Arrive:  Once you arrive and check in for your appointment, you will be taken to a preparation room within the Radiology Department.  A technologist or Nurse will obtain your medical history, verify that you are correctly prepped for the exam, and explain the procedure.  Afterwards, an IV will be started in your arm and electrodes will be placed on your skin for EKG monitoring during the stress portion of the exam. Then you will be escorted to the PET/CT scanner.  There, staff will get you positioned on the  scanner and obtain a blood pressure and EKG.  During the exam, you will continue to be connected to the EKG and blood pressure machines.  A small, safe amount of a radioactive tracer will be injected in your IV to obtain a series of pictures of your heart along with an injection of a stress agent.    After your Exam:  It is recommended that you eat a meal and drink a caffeinated beverage to counter act any effects of the stress agent.  Drink plenty of fluids for the remainder of the day and urinate frequently for  the first couple of hours after the exam.  Your doctor will inform you of your test results within 7-10 business days.  For more information and frequently asked questions, please visit our website: [email or web address]  For questions about your test or how to prepare for your test, please call: Cardiac Imaging Nurse Navigators Office: [phone]   We recommend signing up for the patient portal called MyChart.  Sign up information is provided on this After Visit Summary.  MyChart is used to connect with patients for Virtual Visits (Telemedicine).  Patients are able to view lab/test results, encounter notes, upcoming appointments, etc.  Non-urgent messages can be sent to your provider as well.   To learn more about what you can do with MyChart, go to [email or web address].

## 2024-04-12 ENCOUNTER — Other Ambulatory Visit: Payer: Self-pay | Admitting: Family Medicine

## 2024-04-12 DIAGNOSIS — I10 Essential (primary) hypertension: Secondary | ICD-10-CM

## 2024-04-12 DIAGNOSIS — F0630 Mood disorder due to known physiological condition, unspecified: Secondary | ICD-10-CM

## 2024-04-13 ENCOUNTER — Other Ambulatory Visit: Payer: Self-pay | Admitting: Family Medicine

## 2024-05-04 ENCOUNTER — Ambulatory Visit (HOSPITAL_COMMUNITY): Payer: Self-pay

## 2024-05-04 ENCOUNTER — Other Ambulatory Visit (HOSPITAL_COMMUNITY)

## 2024-05-11 ENCOUNTER — Ambulatory Visit: Admitting: Cardiology

## 2024-05-24 ENCOUNTER — Ambulatory Visit: Admitting: Family Medicine
